# Patient Record
Sex: Female | Born: 1995 | Race: White | Hispanic: No | Marital: Single | Smoking: Former smoker
Health system: Southern US, Community
[De-identification: ages and names within clinical notes are randomized; demographics above are authoritative.]

## PROBLEM LIST (undated history)

## (undated) DIAGNOSIS — Z975 Presence of (intrauterine) contraceptive device: Secondary | ICD-10-CM

## (undated) DIAGNOSIS — B379 Candidiasis, unspecified: Secondary | ICD-10-CM

## (undated) DIAGNOSIS — Z8619 Personal history of other infectious and parasitic diseases: Secondary | ICD-10-CM

## (undated) DIAGNOSIS — Z87440 Personal history of urinary (tract) infections: Principal | ICD-10-CM

## (undated) DIAGNOSIS — N920 Excessive and frequent menstruation with regular cycle: Principal | ICD-10-CM

## (undated) DIAGNOSIS — N946 Dysmenorrhea, unspecified: Secondary | ICD-10-CM

## (undated) DIAGNOSIS — N760 Acute vaginitis: Secondary | ICD-10-CM

## (undated) DIAGNOSIS — R8290 Unspecified abnormal findings in urine: Principal | ICD-10-CM

## (undated) DIAGNOSIS — B9689 Other specified bacterial agents as the cause of diseases classified elsewhere: Secondary | ICD-10-CM

## (undated) DIAGNOSIS — N83209 Unspecified ovarian cyst, unspecified side: Secondary | ICD-10-CM

## (undated) DIAGNOSIS — N390 Urinary tract infection, site not specified: Secondary | ICD-10-CM

## (undated) DIAGNOSIS — N898 Other specified noninflammatory disorders of vagina: Principal | ICD-10-CM

## (undated) HISTORY — DX: Excessive and frequent menstruation with regular cycle: N92.0

## (undated) HISTORY — DX: Unspecified abnormal findings in urine: R82.90

## (undated) HISTORY — DX: Presence of (intrauterine) contraceptive device: Z97.5

## (undated) HISTORY — DX: Other specified bacterial agents as the cause of diseases classified elsewhere: B96.89

## (undated) HISTORY — DX: Dysmenorrhea, unspecified: N94.6

## (undated) HISTORY — DX: Acute vaginitis: N76.0

## (undated) HISTORY — DX: Unspecified ovarian cyst, unspecified side: N83.209

## (undated) HISTORY — PX: TONSILLECTOMY AND ADENOIDECTOMY: SHX28

## (undated) HISTORY — DX: Personal history of urinary (tract) infections: Z87.440

## (undated) HISTORY — DX: Other specified noninflammatory disorders of vagina: N89.8

## (undated) HISTORY — PX: WISDOM TOOTH EXTRACTION: SHX21

## (undated) HISTORY — DX: Candidiasis, unspecified: B37.9

## (undated) HISTORY — DX: Urinary tract infection, site not specified: N39.0

## (undated) HISTORY — DX: Personal history of other infectious and parasitic diseases: Z86.19

---
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
## 2016-02-12 ENCOUNTER — Ambulatory Visit (INDEPENDENT_AMBULATORY_CARE_PROVIDER_SITE_OTHER): Payer: BC Managed Care – PPO | Admitting: Family

## 2016-02-12 VITALS — BP 107/73 | HR 80 | Temp 97.6°F | Ht 62.0 in | Wt 127.0 lb

## 2016-02-12 DIAGNOSIS — R109 Unspecified abdominal pain: Secondary | ICD-10-CM | POA: Diagnosis not present

## 2016-02-12 DIAGNOSIS — N3000 Acute cystitis without hematuria: Secondary | ICD-10-CM | POA: Diagnosis not present

## 2016-02-12 LAB — URINALYSIS, COMPLETE
Bilirubin, UA: NEGATIVE
GLUCOSE, UA: NEGATIVE
Ketones, UA: NEGATIVE
LEUKOCYTES UA: NEGATIVE
Nitrite, UA: NEGATIVE
PROTEIN UA: NEGATIVE
RBC, UA: NEGATIVE
Specific Gravity, UA: >1.03 — ABNORMAL HIGH (ref 1.005–1.030)
Urobilinogen, Ur: 0.2 mg/dL (ref 0.2–1.0)
pH, UA: 6 (ref 5.0–7.5)

## 2016-02-12 LAB — MICROSCOPIC EXAMINATION: Epithelial Cells (non renal): >10 /hpf — AB (ref 0–10)

## 2016-02-12 MED ORDER — SULFAMETHOXAZOLE-TRIMETHOPRIM 800-160 MG PO TABS: 1.0000 | ORAL_TABLET | Freq: Two times a day (BID) | ORAL | Status: DC

## 2016-02-12 NOTE — Progress Notes (Signed)
   Subjective:    Patient ID: Pamela [name], female    DOB: 20-Jun-1996, 20 y.o.   MRN: [id number]  Back Pain This is a new problem. The current episode started in the past 7 days. The problem occurs constantly. The problem is unchanged. Pain location: left flank pain. The pain is at a severity of 10/10. The pain is moderate. Associated symptoms include abdominal pain. Pertinent negatives include no bladder incontinence, bowel incontinence, headaches or numbness.  Abdominal Pain Associated symptoms include frequency and nausea. Pertinent negatives include no headaches, hematuria or vomiting.  Urinary Frequency  This is a new problem. The current episode started in the past 7 days. The problem occurs every urination. The problem has been unchanged. The patient is experiencing no pain. Associated symptoms include flank pain, frequency, nausea and urgency. Pertinent negatives include no chills, hematuria or vomiting. She has tried increased fluids for the symptoms. The treatment provided mild relief.      Review of Systems  Constitutional: Negative.  Negative for chills.  HENT: Negative.   Eyes: Negative.   Respiratory: Negative.  Negative for shortness of breath.   Cardiovascular: Negative.  Negative for palpitations.  Gastrointestinal: Positive for nausea and abdominal pain. Negative for vomiting and bowel incontinence.  Endocrine: Negative.   Genitourinary: Positive for urgency, frequency and flank pain. Negative for bladder incontinence and hematuria.  Musculoskeletal: Positive for back pain.  Neurological: Negative.  Negative for numbness and headaches.  Hematological: Negative.   Psychiatric/Behavioral: Negative.   All other systems reviewed and are negative.      Objective:   Physical Exam  Constitutional: She is oriented to person, place, and time. She appears well-developed and well-nourished. No distress.  HENT:  Head: Normocephalic and atraumatic.  Right Ear: External ear  normal.  Left Ear: External ear normal.  Mouth/Throat: Oropharynx is clear and moist.  Eyes: Pupils are equal, round, and reactive to light.  Neck: Normal range of motion. Neck supple. No thyromegaly present.  Cardiovascular: Normal rate, regular rhythm, normal heart sounds and intact distal pulses.   No murmur heard. Pulmonary/Chest: Effort normal and breath sounds normal. No respiratory distress. She has no wheezes.  Abdominal: Soft. Bowel sounds are normal. She exhibits no distension. There is no tenderness.  Musculoskeletal: Normal range of motion. She exhibits no edema or tenderness.  Negative for CVA tenderness   Neurological: She is alert and oriented to person, place, and time. She has normal reflexes. No cranial nerve deficit.  Skin: Skin is warm and dry.  Psychiatric: She has a normal mood and affect. Her behavior is normal. Judgment and thought content normal.  Vitals reviewed.   BP 107/73 mmHg  Pulse 80  Temp(Src) 97.6 F (36.4 C) (Oral)  Ht 5\' 2"  (1.575 m)  Wt 127 lb (57.607 kg)  BMI 23.22 kg/m2       Assessment & Plan:  1. Abdominal pressure - Urinalysis, Complete  2. Acute cystitis without hematuria -Force fluids AZO over the counter X2 days RTO prn -urine Culture pending  - sulfamethoxazole-trimethoprim (BACTRIM DS) 800-160 MG tablet; Take 1 tablet by mouth 2 (two) times daily.  Dispense: 14 tablet; Refill: 0  Evelina Dun, FNP

## 2016-02-12 NOTE — Patient Instructions (Signed)
Asymptomatic Bacteriuria, Female Asymptomatic bacteriuria is the presence of a large number of bacteria in your urine without the usual symptoms of burning or frequent urination. The following conditions increase the risk of asymptomatic bacteriuria:  Diabetes mellitus.  Advanced age.  Pregnancy in the first trimester.  Kidney stones.  Kidney transplants.  Leaky kidney tube valve in young children (reflux). Treatment for this condition is not needed in most people and can lead to other problems such as too much yeast and growth of resistant bacteria. However, some people, such as pregnant women, do need treatment to prevent kidney infection. Asymptomatic bacteriuria in pregnancy is also associated with fetal growth restriction, premature labor, and newborn death. HOME CARE INSTRUCTIONS Monitor your condition for any changes. The following actions may help to relieve any discomfort you are feeling:  Drink enough water and fluids to keep your urine clear or pale yellow. Go to the bathroom more often to keep your bladder empty.  Keep the area around your vagina and rectum clean. Wipe yourself from front to back after urinating. SEEK IMMEDIATE MEDICAL CARE IF:  You develop signs of an infection such as:  Burning with urination.  Frequency of voiding.  Back pain.  Fever.  You have blood in the urine.  You develop a fever. MAKE SURE YOU:  Understand these instructions.  Will watch your condition.  Will get help right away if you are not doing well or get worse.   This information is not intended to replace advice given to you by your health care provider. Make sure you discuss any questions you have with your health care provider.   Document Released: 11/07/2005 Document Revised: 11/28/2014 Document Reviewed: 04/29/2013 [employer]vier Interactive Patient Education 2016 [employer]  

## 2016-02-14 LAB — URINE CULTURE

## 2016-03-23 ENCOUNTER — Encounter: Payer: Self-pay | Admitting: Family Medicine

## 2016-03-23 ENCOUNTER — Ambulatory Visit (INDEPENDENT_AMBULATORY_CARE_PROVIDER_SITE_OTHER): Payer: BC Managed Care – PPO | Admitting: Family Medicine

## 2016-03-23 ENCOUNTER — Encounter: Payer: Self-pay | Admitting: *Deleted

## 2016-03-23 VITALS — BP 114/72 | HR 80 | Temp 97.2°F | Ht 62.0 in | Wt 127.6 lb

## 2016-03-23 DIAGNOSIS — R350 Frequency of micturition: Secondary | ICD-10-CM

## 2016-03-23 DIAGNOSIS — J209 Acute bronchitis, unspecified: Secondary | ICD-10-CM

## 2016-03-23 MED ORDER — AZITHROMYCIN 250 MG PO TABS: ORAL_TABLET | ORAL | Status: DC

## 2016-03-23 NOTE — Progress Notes (Signed)
   Subjective:    Patient ID: Pamela [name], female    DOB: 08-Feb-1996, 20 y.o.   MRN: [id number]  HPI 20 year old with a three-day history of sinus pressure and drainage cough with wheezing. Cough is productive of yellow sputum. Patient is a smoker. No fever or chills.  Patient Active Problem List   Diagnosis Date Noted  . History of UTI 09/22/2015  . History of chlamydia 09/22/2015  . Nexplanon in place 08/21/2015  . Bad odor of urine 08/21/2015  . UTI (lower urinary tract infection) 08/21/2015  . BV (bacterial vaginosis) 01/28/2015  . Vaginal discharge 12/11/2014  . Yeast infection 12/11/2014  . Menorrhagia with regular cycle 10/14/2014  . Dysmenorrhea 10/14/2014   Outpatient Encounter Prescriptions as of 03/23/2016  Medication Sig  . EPIPEN 2-PAK 0.3 MG/0.3ML SOAJ injection 0.3 mg as needed. Reported on 02/12/2016  . etonogestrel (NEXPLANON) 68 MG IMPL implant 1 each by Subdermal route once.  . [DISCONTINUED] sulfamethoxazole-trimethoprim (BACTRIM DS) 800-160 MG tablet Take 1 tablet by mouth 2 (two) times daily.   No facility-administered encounter medications on file as of 03/23/2016.      Review of Systems  Constitutional: Negative.   HENT: Positive for congestion and sinus pressure.   Respiratory: Positive for cough.        Objective:   Physical Exam  Constitutional: She appears well-developed and well-nourished.  HENT:  Mouth/Throat: Oropharynx is clear and moist.  Both maxillary and frontal sinuses are tender to percussion  Cardiovascular: Normal rate and regular rhythm.   Pulmonary/Chest: No respiratory distress.          Assessment & Plan:  1. Urinary frequency Apparently left unable to get a urine - Urinalysis, Complete  2. Acute bronchitis, unspecified organism With wheezing and productive cough suspect acute bronchitis. Will treat with Zithromax (Z-Pak) and a sample of Brio for wheezing and cough. Try to quit smoking at least while she's got the  symptoms  Wardell Honour MD

## 2016-05-17 ENCOUNTER — Encounter: Payer: Self-pay | Admitting: Obstetrics & Gynecology

## 2016-05-17 ENCOUNTER — Ambulatory Visit (INDEPENDENT_AMBULATORY_CARE_PROVIDER_SITE_OTHER): Payer: BC Managed Care – PPO | Admitting: Obstetrics & Gynecology

## 2016-05-17 VITALS — BP 110/60 | HR 76 | Wt 124.0 lb

## 2016-05-17 DIAGNOSIS — N939 Abnormal uterine and vaginal bleeding, unspecified: Secondary | ICD-10-CM | POA: Diagnosis not present

## 2016-05-17 DIAGNOSIS — N930 Postcoital and contact bleeding: Secondary | ICD-10-CM

## 2016-05-17 DIAGNOSIS — R252 Cramp and spasm: Secondary | ICD-10-CM | POA: Diagnosis not present

## 2016-05-17 DIAGNOSIS — S30814A Abrasion of vagina and vulva, initial encounter: Secondary | ICD-10-CM | POA: Diagnosis not present

## 2016-05-17 MED ORDER — MEGESTROL ACETATE 40 MG PO TABS: ORAL_TABLET | ORAL | Status: DC

## 2016-05-17 NOTE — Progress Notes (Signed)
Patient ID: Pamela [name], female   DOB: [birth year], 20 y.o.   MRN: [id number]      Chief Complaint  Patient presents with  . gyn visit    c/o bright red bleeding/ heavy since Saturday/ stomach pain    Blood pressure 110/60, pulse 76, weight 124 lb (56.2 kg), last menstrual period 04/27/2016.  20 y.o. G0P0000 Patient's last menstrual period was 04/27/2016. The current method of family planning is nexplanon.  Outpatient Encounter Prescriptions as of 05/17/2016  Medication Sig Note  . etonogestrel (NEXPLANON) 68 MG IMPL implant 1 each by Subdermal route once.   . EPIPEN 2-PAK 0.3 MG/0.3ML SOAJ injection 0.3 mg as needed. Reported on 05/17/2016 01/28/2015: Received from: External Pharmacy  . [DISCONTINUED] azithromycin (ZITHROMAX) 250 MG tablet Take as directed   . [DISCONTINUED] megestrol (MEGACE) 40 MG tablet 3 tablets a day for 5 days, 2 tablets a day for 5 days then 1 tablet daily (Patient not taking: Reported on 06/14/2016)    No facility-administered encounter medications on file as of 05/17/2016.    Subjective Pt began vaginal bleeding after a sexual encounter a few days ago Unsure if related or not Heavy some cramping less now  Objective Gen WDWN female NAD Vulva:  normal appearing vulva with no masses, tenderness or lesions Vagina:  4 cm vaginal laceration not actively bleeding not infected Cervix:  no cervical motion tenderness and no lesions Uterus:  normal size, contour, position, consistency, mobility, non-tender Adnexa: ovaries:present,  normal adnexa in size, nontender and no masses   Pertinent ROS No burning with urination, frequency or urgency No nausea, vomiting or diarrhea Nor fever chills or other constitutional symptoms   Labs or studies     Impression Diagnoses this Encounter::   ICD-9-CM ICD-10-CM   1. Vaginal abrasion, initial encounter 911.0 S30.814A   2. Abnormal uterine bleeding (AUB) 626.9 N93.9     Established relevant  diagnosis(es):   Plan/Recommendations: Meds ordered this encounter  Medications  . DISCONTD: megestrol (MEGACE) 40 MG tablet    Sig: 3 tablets a day for 5 days, 2 tablets a day for 5 days then 1 tablet daily    Dispense:  35 tablet    Refill:  3    Labs or Scans Ordered: No orders of the defined types were placed in this encounter.   Management:: No specific therapy needed except pelvic rest for 2 weeks Make sure effective moisture prior to vaginal intercourse  Follow up Return if symptoms worsen or fail to improve.         All questions were answered.

## 2016-06-01 ENCOUNTER — Ambulatory Visit: Payer: BC Managed Care – PPO | Admitting: Advanced Practice Midwife

## 2016-06-01 ENCOUNTER — Encounter: Payer: Self-pay | Admitting: Advanced Practice Midwife

## 2016-06-14 ENCOUNTER — Encounter: Payer: Self-pay | Admitting: Family Medicine

## 2016-06-14 ENCOUNTER — Ambulatory Visit (INDEPENDENT_AMBULATORY_CARE_PROVIDER_SITE_OTHER): Payer: BC Managed Care – PPO | Admitting: Family Medicine

## 2016-06-14 VITALS — BP 107/69 | HR 76 | Temp 97.3°F | Ht 62.0 in | Wt 126.8 lb

## 2016-06-14 DIAGNOSIS — N898 Other specified noninflammatory disorders of vagina: Secondary | ICD-10-CM | POA: Diagnosis not present

## 2016-06-14 DIAGNOSIS — N760 Acute vaginitis: Secondary | ICD-10-CM | POA: Diagnosis not present

## 2016-06-14 DIAGNOSIS — A499 Bacterial infection, unspecified: Secondary | ICD-10-CM

## 2016-06-14 DIAGNOSIS — R300 Dysuria: Secondary | ICD-10-CM

## 2016-06-14 DIAGNOSIS — B9689 Other specified bacterial agents as the cause of diseases classified elsewhere: Secondary | ICD-10-CM

## 2016-06-14 LAB — URINALYSIS, COMPLETE
Bilirubin, UA: NEGATIVE
GLUCOSE, UA: NEGATIVE
Ketones, UA: NEGATIVE
LEUKOCYTES UA: NEGATIVE
NITRITE UA: NEGATIVE
Protein, UA: NEGATIVE
RBC, UA: NEGATIVE
SPEC GRAV UA: 1.025 (ref 1.005–1.030)
Urobilinogen, Ur: 0.2 mg/dL (ref 0.2–1.0)
pH, UA: 6.5 (ref 5.0–7.5)

## 2016-06-14 LAB — WET PREP FOR TRICH, YEAST, CLUE
CLUE CELL EXAM: NEGATIVE
Trichomonas Exam: NEGATIVE
YEAST EXAM: NEGATIVE

## 2016-06-14 LAB — MICROSCOPIC EXAMINATION: RBC, UA: NONE SEEN /hpf (ref 0–?)

## 2016-06-14 MED ORDER — METRONIDAZOLE 0.75 % VA GEL: 1.0000 | Freq: Every day | VAGINAL | 0 refills | Status: DC

## 2016-06-14 NOTE — Progress Notes (Signed)
Subjective:  Patient ID: Pamela [name], female    DOB: 20-Sep-1996  Age: 20 y.o. MRN: [id number]  CC: Urinary Tract Infection (urinary frequency, LBP x 1 wk) and Vaginal Discharge (stong odor x 1 wk)   HPI Pamela [name] presents for Hx BV. Recent new partner. Used condom. First activity in 6 mos. DC started several days aago. Fishyodor similar to previous BV episodes.some midline LBP.    History Pamela [name] has a past medical history of Bad odor of urine (08/21/2015); BV (bacterial vaginosis) (01/28/2015); Dysmenorrhea (10/14/2014); History of chlamydia (09/22/2015); History of UTI (09/22/2015); Menorrhagia with regular cycle (10/14/2014); Nexplanon in place (08/21/2015); UTI (lower urinary tract infection) (08/21/2015); Vaginal discharge (12/11/2014); and Yeast infection (12/11/2014).   She has a past surgical history that includes Wisdom tooth extraction and Tonsillectomy and adenoidectomy.   Her family history includes COPD in her paternal grandfather; Cancer in her maternal grandmother; Diabetes in her paternal grandmother; Hypertension in her paternal grandfather and paternal grandmother.She reports that she has been smoking Cigarettes.  She has smoked for the past 0.50 years. She has never used smokeless tobacco. She reports that she does not drink alcohol or use drugs.    ROS Review of Systems  Constitutional: Negative for chills, diaphoresis and fever.  HENT: Negative for congestion.   Eyes: Negative for visual disturbance.  Respiratory: Negative for cough and shortness of breath.   Cardiovascular: Negative for chest pain and palpitations.  Gastrointestinal: Negative for constipation, diarrhea and nausea.  Genitourinary: Positive for dysuria, frequency and urgency. Negative for decreased urine volume, flank pain, hematuria, menstrual problem and pelvic pain.  Musculoskeletal: Negative for arthralgias and joint swelling.  Skin: Negative for rash.  Neurological: Negative for dizziness and  numbness.    Objective:  BP 107/69 (BP Location: Right Arm, Patient Position: Sitting, Cuff Size: Normal)   Pulse 76   Temp 97.3 F (36.3 C) (Oral)   Ht 5\' 2"  (1.575 m)   Wt 126 lb 12.8 oz (57.5 kg)   LMP 04/27/2016   SpO2 100%   BMI 23.19 kg/m   BP Readings from Last 3 Encounters:  06/14/16 107/69  05/17/16 110/60  03/23/16 114/72    Wt Readings from Last 3 Encounters:  06/14/16 126 lb 12.8 oz (57.5 kg) (51 %, Z= 0.02)*  05/17/16 124 lb (56.2 kg) (46 %, Z= -0.11)*  03/23/16 127 lb 9.6 oz (57.9 kg) (54 %, Z= 0.09)*   * Growth percentiles are based on CDC 2-20 Years data.     Physical Exam  Constitutional: She is oriented to person, place, and time. She appears well-developed and well-nourished.  HENT:  Head: Normocephalic and atraumatic.  Cardiovascular: Normal rate and regular rhythm.   No murmur heard. Pulmonary/Chest: Effort normal and breath sounds normal.  Abdominal: Soft. Bowel sounds are normal. She exhibits no mass. There is no tenderness. There is no rebound and no guarding.  Musculoskeletal: She exhibits no tenderness.  Neurological: She is alert and oriented to person, place, and time.  Skin: Skin is warm and dry.  Psychiatric: She has a normal mood and affect. Her behavior is normal.     Lab Results  Component Value Date   HGB 14.6 07/03/2013    US Breast Right  Result Date: 01/16/2013 *RADIOLOGY REPORT* Clinical Data:  The patient reports focal pain right breast upper outer quadrant.  Additionally, the history reported a questioned palpable finding in the right breast six o'clock location, but the patient specifically states that no palpable abnormality was found  been at that or any other area in either breast after recent physical exam. RIGHT BREAST ULTRASOUND Comparison:  None. On physical exam, I palpate no abnormality in the right upper outer quadrant. Findings: Ultrasound is performed, showing normal-appearing fibroglandular tissue in the right  upper outer quadrant. IMPRESSION: No evidence for malignancy at the site of the questioned focal pain right upper outer quadrant.  Clinical management is recommended. RECOMMENDATION: Screening mammography is recommended beginning at the age of 20. I have discussed the findings and recommendations with the patient. Results were also provided in writing at the conclusion of the visit. BI-RADS CATEGORY 1:  Negative. Original Report Authenticated By: Conchita Paris, M.D.    Assessment & Plan:   Pamela [name] was seen today for urinary tract infection and vaginal discharge.  Diagnoses and all orders for this visit:  Bacterial vaginitis  Dysuria -     Urinalysis, Complete  Vaginal discharge -     WET PREP FOR TRICH, YEAST, CLUE -     GC/Chlamydia Probe Amp  Other orders -     metroNIDAZOLE (METROGEL VAGINAL) 0.75 % vaginal gel; Place 1 Applicatorful vaginally at bedtime. -     Microscopic Examination   I am having Pamela [name] start on metroNIDAZOLE. I am also having her maintain her EPIPEN 2-PAK, etonogestrel, and megestrol.  Meds ordered this encounter  Medications  . metroNIDAZOLE (METROGEL VAGINAL) 0.75 % vaginal gel    Sig: Place 1 Applicatorful vaginally at bedtime.    Dispense:  70 g    Refill:  0     Follow-up: Return if symptoms worsen or fail to improve.  Claretta Fraise, M.D.

## 2016-06-16 LAB — GC/CHLAMYDIA PROBE AMP
Chlamydia trachomatis, NAA: NEGATIVE
NEISSERIA GONORRHOEAE BY PCR: NEGATIVE

## 2016-07-07 ENCOUNTER — Encounter: Payer: Self-pay | Admitting: Obstetrics & Gynecology

## 2016-07-07 ENCOUNTER — Ambulatory Visit (INDEPENDENT_AMBULATORY_CARE_PROVIDER_SITE_OTHER): Payer: BC Managed Care – PPO | Admitting: Obstetrics & Gynecology

## 2016-07-07 VITALS — BP 120/80 | HR 80 | Wt 127.0 lb

## 2016-07-07 DIAGNOSIS — Z975 Presence of (intrauterine) contraceptive device: Secondary | ICD-10-CM

## 2016-07-07 DIAGNOSIS — N719 Inflammatory disease of uterus, unspecified: Secondary | ICD-10-CM | POA: Diagnosis not present

## 2016-07-07 DIAGNOSIS — N939 Abnormal uterine and vaginal bleeding, unspecified: Secondary | ICD-10-CM | POA: Diagnosis not present

## 2016-07-07 MED ORDER — MEGESTROL ACETATE 40 MG PO TABS: ORAL_TABLET | ORAL | 3 refills | Status: DC

## 2016-07-07 MED ORDER — DOXYCYCLINE HYCLATE 100 MG PO TABS: 100.0000 mg | ORAL_TABLET | Freq: Two times a day (BID) | ORAL | 0 refills | Status: DC

## 2016-07-07 NOTE — Progress Notes (Signed)
      Chief Complaint  Patient presents with  . Follow-up    Blood pressure 120/80, pulse 80, weight 127 lb (57.6 kg).  20 y.o. G0P0000 No LMP recorded. Patient has had an implant. The current method of family planning is Nexplanon.  Subjective Pt has had vaginal bleeding and discomfort since she had sex Saturday No pain with sex this time Some strong smell to her urine No odor to her discharge  Objective NEFG  Blood in the vault no evidence of laceration or trauma No discharge +/- CMT some tenderness to uterine palpation Adnexa negative  Pertinent ROS No burning with urination, frequency or urgency No nausea, vomiting or diarrhea Nor fever chills or other constitutional symptoms   Labs or studies     Impression Diagnoses this Encounter::   ICD-9-CM ICD-10-CM   1. Abnormal uterine bleeding (AUB) 626.9 N93.9   2. Endometritis 615.9 N71.9     Established relevant diagnosis(es):   Plan/Recommendations: Meds ordered this encounter  Medications  . doxycycline (VIBRA-TABS) 100 MG tablet    Sig: Take 1 tablet (100 mg total) by mouth 2 (two) times daily.    Dispense:  20 tablet    Refill:  0  . megestrol (MEGACE) 40 MG tablet    Sig: 3 tablets a day for 5 days, 2 tablets a day for 5 days then 1 tablet daily    Dispense:  35 tablet    Refill:  3    Labs or Scans Ordered: No orders of the defined types were placed in this encounter.   Management:: Doxycycline course for possible endometritis which can also help stabilize/synchronize her endometrium Megestrol to synchronize her endometrium in light of Nexplanon/progeseterone only effects  Follow up Return in about 1 month (around 08/07/2016) for Follow up, with Dr Elonda Husky.     All questions were answered.

## 2016-08-05 ENCOUNTER — Encounter: Payer: Self-pay | Admitting: Family Medicine

## 2016-08-05 ENCOUNTER — Ambulatory Visit (INDEPENDENT_AMBULATORY_CARE_PROVIDER_SITE_OTHER): Payer: BC Managed Care – PPO | Admitting: Family Medicine

## 2016-08-05 DIAGNOSIS — F418 Other specified anxiety disorders: Secondary | ICD-10-CM | POA: Diagnosis not present

## 2016-08-05 DIAGNOSIS — F419 Anxiety disorder, unspecified: Principal | ICD-10-CM

## 2016-08-05 DIAGNOSIS — F32A Depression, unspecified: Secondary | ICD-10-CM | POA: Insufficient documentation

## 2016-08-05 DIAGNOSIS — F329 Major depressive disorder, single episode, unspecified: Secondary | ICD-10-CM | POA: Insufficient documentation

## 2016-08-05 MED ORDER — ESCITALOPRAM OXALATE 10 MG PO TABS: 10.0000 mg | ORAL_TABLET | Freq: Every day | ORAL | 1 refills | Status: DC
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
## 2016-08-06 LAB — CBC WITH DIFFERENTIAL/PLATELET
BASOS ABS: 0.1 10*3/uL (ref 0.0–0.2)
Basos: 1 %
EOS (ABSOLUTE): 0.3 10*3/uL (ref 0.0–0.4)
Eos: 4 %
HEMOGLOBIN: 14.6 g/dL (ref 11.1–15.9)
Hematocrit: 42.4 % (ref 34.0–46.6)
IMMATURE GRANS (ABS): 0 10*3/uL (ref 0.0–0.1)
Immature Granulocytes: 0 %
LYMPHS: 31 %
Lymphocytes Absolute: 2.7 10*3/uL (ref 0.7–3.1)
MCH: 31.7 pg (ref 26.6–33.0)
MCHC: 34.4 g/dL (ref 31.5–35.7)
MCV: 92 fL (ref 79–97)
MONOCYTES: 8 %
Monocytes Absolute: 0.7 10*3/uL (ref 0.1–0.9)
NEUTROS ABS: 4.9 10*3/uL (ref 1.4–7.0)
Neutrophils: 56 %
PLATELETS: 261 10*3/uL (ref 150–379)
RBC: 4.61 x10E6/uL (ref 3.77–5.28)
RDW: 12.6 % (ref 12.3–15.4)
WBC: 8.7 10*3/uL (ref 3.4–10.8)

## 2016-08-06 LAB — TSH: TSH: 1.52 u[IU]/mL (ref 0.450–4.500)

## 2016-08-08 ENCOUNTER — Ambulatory Visit: Payer: BC Managed Care – PPO | Admitting: Obstetrics & Gynecology

## 2016-09-02 ENCOUNTER — Ambulatory Visit: Payer: BC Managed Care – PPO | Admitting: Family Medicine

## 2017-01-11 ENCOUNTER — Encounter: Payer: Self-pay | Admitting: Family Medicine

## 2017-01-11 ENCOUNTER — Telehealth: Payer: Self-pay | Admitting: Obstetrics & Gynecology

## 2017-01-11 ENCOUNTER — Ambulatory Visit (INDEPENDENT_AMBULATORY_CARE_PROVIDER_SITE_OTHER): Payer: BC Managed Care – PPO | Admitting: Family Medicine

## 2017-01-11 VITALS — BP 114/74 | HR 88 | Temp 97.7°F | Ht 62.01 in | Wt 129.8 lb

## 2017-01-11 DIAGNOSIS — F32A Depression, unspecified: Secondary | ICD-10-CM

## 2017-01-11 DIAGNOSIS — F418 Other specified anxiety disorders: Secondary | ICD-10-CM

## 2017-01-11 DIAGNOSIS — F329 Major depressive disorder, single episode, unspecified: Secondary | ICD-10-CM

## 2017-01-11 DIAGNOSIS — F419 Anxiety disorder, unspecified: Secondary | ICD-10-CM

## 2017-01-11 DIAGNOSIS — M545 Low back pain: Secondary | ICD-10-CM

## 2017-01-11 LAB — URINALYSIS, COMPLETE
Bilirubin, UA: NEGATIVE
Glucose, UA: NEGATIVE
KETONES UA: NEGATIVE
Leukocytes, UA: NEGATIVE
NITRITE UA: NEGATIVE
Protein, UA: NEGATIVE
RBC UA: NEGATIVE
SPEC GRAV UA: 1.02 (ref 1.005–1.030)
Urobilinogen, Ur: 0.2 mg/dL (ref 0.2–1.0)
pH, UA: 7 (ref 5.0–7.5)

## 2017-01-11 LAB — MICROSCOPIC EXAMINATION
Epithelial Cells (non renal): >10 /hpf — AB (ref 0–10)
RENAL EPITHEL UA: NONE SEEN /HPF

## 2017-01-11 MED ORDER — FLUOXETINE HCL 20 MG PO TABS: 20.0000 mg | ORAL_TABLET | Freq: Every day | ORAL | 1 refills | Status: DC

## 2017-01-11 MED ORDER — FLUCONAZOLE 150 MG PO TABS: 150.0000 mg | ORAL_TABLET | Freq: Once | ORAL | 0 refills | Status: AC

## 2017-01-11 MED ORDER — CEPHALEXIN 500 MG PO CAPS: 500.0000 mg | ORAL_CAPSULE | Freq: Four times a day (QID) | ORAL | 0 refills | Status: DC

## 2017-01-11 NOTE — Telephone Encounter (Signed)
Pt states she has had a Nexplanon in for 2 yrs and now is having pain at incision site that radiates into muscle around device, pt denies any redness or swelling but states it is tender to the touch.  Advised pt need to have OV to evaluate and call transferred to front staff for appointment to be made.

## 2017-01-11 NOTE — Progress Notes (Signed)
BP 114/74   Pulse 88   Temp 97.7 F (36.5 C) (Oral)   Ht 5' 2.01" (1.575 m)   Wt 129 lb 12.8 oz (58.9 kg)   BMI 23.73 kg/m    Subjective:    Patient ID: Pamela [name], female    DOB: 04/01/1996, 21 y.o.   MRN: [id number]  HPI: Pamela [name] is a 21 y.o. female presenting on 01/11/2017 for Back Pain (x 3 days)   HPI Back pain Patient has been having low back pain that has been intense and 7 out of 10 for the past 3 days. It is bilateral and near her belt line on her lower back. She denies it radiating anywhere else such as down her legs or upper back. She has not had pain this severe previously. She denies any specific trauma or twisting or event that caused this. She has been working in a more physical job as a Engineer, site and moving things and she thinks it may be associated to that. She denies any fevers or chills or overlying skin changes.She thinks she may have had some urinary frequency and wants her urine tested but denies any hematuria or fevers or chills or abdominal pain  Anxiety and depression Patient has been having a lot of issues with anxiety and her stress building up it's keeping her up at night. She feels like the Lexapro made her feel weird and did not like it at all and took it for about 3 weeks but then stopped taking it and would like to try something else for this. She denies to me feelings of sadness or depression or feeling down. She says that at least 2 weeks and she was on the Lexapro. She denies any suicidal ideations or thoughts of hurting herself. She says she just needs something that will help her a little better with her anxiety and feelings of depression but doesn't make her feel the way that the Lexapro did.  Relevant past medical, surgical, family and social history reviewed and updated as indicated. Interim medical history since our last visit reviewed. Allergies and medications reviewed and updated.  Review of Systems  Constitutional: Negative  for chills and fever.  Respiratory: Negative for chest tightness and shortness of breath.   Cardiovascular: Negative for chest pain and leg swelling.  Genitourinary: Negative for difficulty urinating and dysuria.  Musculoskeletal: Positive for back pain. Negative for gait problem.  Skin: Negative for rash.  Neurological: Negative for light-headedness and headaches.  Psychiatric/Behavioral: Positive for dysphoric mood and sleep disturbance. Negative for agitation, behavioral problems, self-injury and suicidal ideas. The patient is nervous/anxious.   All other systems reviewed and are negative.   Per HPI unless specifically indicated above     Objective:    BP 114/74   Pulse 88   Temp 97.7 F (36.5 C) (Oral)   Ht 5' 2.01" (1.575 m)   Wt 129 lb 12.8 oz (58.9 kg)   BMI 23.73 kg/m   Wt Readings from Last 3 Encounters:  01/11/17 129 lb 12.8 oz (58.9 kg) (54 %, Z= 0.10)*  08/05/16 128 lb (58.1 kg) (53 %, Z= 0.06)*  07/07/16 127 lb (57.6 kg) (51 %, Z= 0.03)*   * Growth percentiles are based on CDC 2-20 Years data.    Physical Exam  Constitutional: She is oriented to person, place, and time. She appears well-developed and well-nourished. No distress.  Eyes: Conjunctivae are normal.  Neck: Neck supple. No thyromegaly present.  Cardiovascular: Normal rate, regular rhythm,  normal heart sounds and intact distal pulses.   No murmur heard. Pulmonary/Chest: Effort normal and breath sounds normal. No respiratory distress. She has no wheezes.  Abdominal: Soft. Bowel sounds are normal. She exhibits no distension. There is no tenderness. There is no rebound and no guarding.  Musculoskeletal: Normal range of motion. She exhibits no edema.       Lumbar back: She exhibits tenderness (Bilateral lower back paraspinal tenderness, consistent with muscular strain, negative straight leg raise bilaterally) and spasm. She exhibits normal range of motion, no bony tenderness and normal pulse.    Lymphadenopathy:    She has no cervical adenopathy.  Neurological: She is alert and oriented to person, place, and time. Coordination normal.  Skin: Skin is warm and dry. No rash noted. She is not diaphoretic.  Psychiatric: Her behavior is normal. Her mood appears anxious. She exhibits a depressed mood. She expresses no suicidal ideation. She expresses no suicidal plans.  Nursing note and vitals reviewed.  Urinalysis: Nitrite negative, 0-5 WBCs, otherwise negative    Assessment & Plan:   Problem List Items Addressed This Visit      Other   Anxiety and depression   Relevant Medications   FLUoxetine (PROZAC) 20 MG tablet    Other Visit Diagnoses    Low back pain, unspecified back pain laterality, unspecified chronicity, with sciatica presence unspecified    -  Primary   Relevant Medications   cephALEXin (KEFLEX) 500 MG capsule   Other Relevant Orders   Urinalysis, Complete (Completed)   Microscopic Examination (Completed)     We'll treat like UTI just in case it may be correlated to that   Follow up plan: Return in about 4 weeks (around 02/08/2017), or if symptoms worsen or fail to improve, for Recheck anxiety and depression.  Counseling provided for all of the vaccine components Orders Placed This Encounter  Procedures  . Urinalysis, Complete    Caryl Pina, MD [facility] 01/11/2017, 2:36 PM

## 2017-01-17 ENCOUNTER — Ambulatory Visit: Payer: BC Managed Care – PPO | Admitting: Obstetrics & Gynecology

## 2017-03-21 ENCOUNTER — Ambulatory Visit: Payer: BC Managed Care – PPO | Admitting: Family

## 2017-03-23 ENCOUNTER — Encounter: Payer: Self-pay | Admitting: Pediatrics

## 2017-03-23 ENCOUNTER — Ambulatory Visit (INDEPENDENT_AMBULATORY_CARE_PROVIDER_SITE_OTHER): Payer: BC Managed Care – PPO | Admitting: Pediatrics

## 2017-03-23 VITALS — BP 113/72 | HR 80 | Temp 97.7°F | Ht 62.02 in | Wt 127.0 lb

## 2017-03-23 DIAGNOSIS — R062 Wheezing: Secondary | ICD-10-CM | POA: Diagnosis not present

## 2017-03-23 DIAGNOSIS — Z720 Tobacco use: Secondary | ICD-10-CM

## 2017-03-23 DIAGNOSIS — J069 Acute upper respiratory infection, unspecified: Secondary | ICD-10-CM

## 2017-03-23 MED ORDER — SPACER/AERO CHAMBER MOUTHPIECE MISC: 1.0000 | Freq: Four times a day (QID) | 0 refills | Status: DC | PRN

## 2017-03-23 MED ORDER — ALBUTEROL SULFATE HFA 108 (90 BASE) MCG/ACT IN AERS: 2.0000 | INHALATION_SPRAY | Freq: Four times a day (QID) | RESPIRATORY_TRACT | 2 refills | Status: DC | PRN

## 2017-03-23 NOTE — Patient Instructions (Addendum)
Netipot with distilled water 2-3 times a day to clear out sinuses Or Normal saline nasal spray Flonase steroid nasal spray Antihistamine daily such as cetirizine Ibuprofen/tylenol Lots of fluids  Albuterol 2 puffs three times a day for wheezing/cough

## 2017-03-23 NOTE — Progress Notes (Signed)
  Subjective:   Patient ID: Pamela [name], female    DOB: 1996/01/17, 21 y.o.   MRN: [id number] CC: Cough; Nasal Congestion; and Facial Pain  HPI: Pamela [name] is a 21 y.o. female presenting for Cough; Nasal Congestion; and Facial Pain  Ongoing for past 3 days +nasal drainage, coughing Sore throat yesterday, slightly scratchy still today Trying mucinex at home No fevers Appetite has been fine Smoking daily, less since shes been sick Interested in quitting  Relevant past medical, surgical, family and social history reviewed. Allergies and medications reviewed and updated. History  Smoking Status  . Current Every Day Smoker  . Years: 0.50  . Types: Cigarettes  Smokeless Tobacco  . Never Used    Comment: smokes 10 cig daily   ROS: Per HPI   Objective:    BP 113/72   Pulse 80   Temp 97.7 F (36.5 C) (Oral)   Ht 5' 2.02" (1.575 m)   Wt 127 lb (57.6 kg)   BMI 23.21 kg/m   Wt Readings from Last 3 Encounters:  03/23/17 127 lb (57.6 kg) (48 %, Z= -0.05)*  01/11/17 129 lb 12.8 oz (58.9 kg) (54 %, Z= 0.10)*  08/05/16 128 lb (58.1 kg) (53 %, Z= 0.06)*   * Growth percentiles are based on CDC 2-20 Years data.    Gen: NAD, alert, cooperative with exam, NCAT EYES: EOMI, no conjunctival injection, or no icterus ENT:  TMs dull gray b/l, OP without erythema, some discomfort palpation max sinuses LYMPH: small < 1cm cervical LAD CV: NRRR, normal S1/S2, no murmur Resp: moving air well, slight exp wheeze present throughout, normal WOB Abd: +BS, soft, NTND. no guarding or organomegaly Ext: No edema, warm Neuro: Alert and oriented  Assessment & Plan:  Avanni was seen today for cough, nasal congestion and facial pain.  Diagnoses and all orders for this visit:  Acute URI Symptoms for 3 days Discussed symptom care including sinus rinses, antihistamine, flonase Return precautions discussed.  Wheezing Slight wheeze Bothered most by coughing now, albuterol likely to help  cough -     albuterol (PROVENTIL HFA;VENTOLIN HFA) 108 (90 Base) MCG/ACT inhaler; Inhale 2 puffs into the lungs every 6 (six) hours as needed for wheezing or shortness of breath. -     Spacer/Aero Chamber Mouthpiece MISC; 1 each by Does not apply route every 6 (six) hours as needed.  Tobacco use Encouraged cessation  Follow up plan: prn Assunta Found, MD [place]

## 2017-03-24 ENCOUNTER — Telehealth: Payer: Self-pay | Admitting: Pediatrics

## 2017-03-24 ENCOUNTER — Ambulatory Visit: Payer: BC Managed Care – PPO | Admitting: Family

## 2017-03-24 DIAGNOSIS — J40 Bronchitis, not specified as acute or chronic: Secondary | ICD-10-CM

## 2017-03-24 MED ORDER — AZITHROMYCIN 250 MG PO TABS: ORAL_TABLET | ORAL | 0 refills | Status: DC

## 2017-03-24 NOTE — Telephone Encounter (Signed)
Patient aware and verbalizes understanding. 

## 2017-03-24 NOTE — Telephone Encounter (Signed)
Sent in azithromycin for bronchitis if coughing not improving over next few days.  Should cont sinus rinses, flonase, antihistamine for nasal congestion. If wheezing continues we can try nebulizer machine and albuterol, tends to be cheaper than the inhaler but less convenient.

## 2017-03-24 NOTE — Addendum Note (Signed)
Addended by: Eustaquio Maize on: 03/24/2017 10:37 AM   Modules accepted: Orders

## 2017-03-24 NOTE — Telephone Encounter (Signed)
Pt called still feels horrible. Wasn't able to get the inhaler rx yesterday b/c she couldn't afford it. She still has yellow mucous from nose, wheezing, chest congestion, can't breathe due to all congestion in chest. Patient would like to have antibiotic called to [facility].

## 2017-03-27 ENCOUNTER — Telehealth: Payer: Self-pay | Admitting: Pediatrics

## 2017-03-27 DIAGNOSIS — R062 Wheezing: Secondary | ICD-10-CM

## 2017-03-27 MED ORDER — ALBUTEROL SULFATE (2.5 MG/3ML) 0.083% IN NEBU: 2.5000 mg | INHALATION_SOLUTION | Freq: Four times a day (QID) | RESPIRATORY_TRACT | 1 refills | Status: DC | PRN

## 2017-03-27 NOTE — Telephone Encounter (Signed)
Needs to be seen, if getting worse may need steroids.

## 2017-03-27 NOTE — Telephone Encounter (Signed)
appt made

## 2017-03-27 NOTE — Telephone Encounter (Signed)
I called pt - we discussed the albuterol - her grandmother had an alb inhaler she had never used = she gave it to her. 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  is there anything else we can do or add to her regimen.

## 2017-03-28 ENCOUNTER — Telehealth: Payer: Self-pay | Admitting: *Deleted

## 2017-03-28 ENCOUNTER — Ambulatory Visit: Payer: BC Managed Care – PPO | Admitting: Family

## 2017-03-28 NOTE — Telephone Encounter (Signed)
Patient aware that nebulizer machine Rx is ready for pick up.

## 2017-03-29 ENCOUNTER — Encounter: Payer: Self-pay | Admitting: Pediatrics

## 2017-03-29 ENCOUNTER — Telehealth: Payer: Self-pay | Admitting: Pediatrics

## 2017-03-29 NOTE — Telephone Encounter (Signed)
Home phone number is not in service and patient's cell phone does not have a voice mail set up.

## 2017-08-01 ENCOUNTER — Encounter: Payer: Self-pay | Admitting: Adult Health

## 2017-08-01 ENCOUNTER — Ambulatory Visit (INDEPENDENT_AMBULATORY_CARE_PROVIDER_SITE_OTHER): Payer: BC Managed Care – PPO | Admitting: Adult Health

## 2017-08-01 VITALS — BP 128/80 | HR 90 | Ht 62.0 in | Wt 138.5 lb

## 2017-08-01 DIAGNOSIS — R102 Pelvic and perineal pain: Secondary | ICD-10-CM | POA: Diagnosis not present

## 2017-08-01 DIAGNOSIS — Z975 Presence of (intrauterine) contraceptive device: Secondary | ICD-10-CM

## 2017-08-01 LAB — POCT URINALYSIS DIPSTICK
Blood, UA: NEGATIVE
Glucose, UA: NEGATIVE
Ketones, UA: NEGATIVE
LEUKOCYTES UA: NEGATIVE
NITRITE UA: NEGATIVE
PROTEIN UA: NEGATIVE

## 2017-08-01 NOTE — Progress Notes (Signed)
Subjective:     Patient ID: Pamela [name], female   DOB: [birth year], 21 y.o.   MRN: [id number]  HPI [name] is a 21 year old white female in complaining of pelvic pain for 1 week, has nexplanon and is sexually active. She describes pain has burning like.   Review of Systems Pelvic pain for 1 week  Reviewed past medical,surgical, social and family history. Reviewed medications and allergies.     Objective:   Physical Exam BP 128/80 (BP Location: Left Arm, Patient Position: Sitting, Cuff Size: Normal)   Pulse 90   Ht 5\' 2"  (1.575 m)   Wt 138 lb 8 oz (62.8 kg)   BMI 25.33 kg/m urine negative,Skin warm and dry.Pelvic: external genitalia is normal in appearance no lesions, vagina: normal in appearance,urethra has no lesions or masses noted, cervix:smooth, no CMT, uterus: normal size, shape and contour, mildly tender, no masses felt, adnexa: no masses,mild LLQ tenderness noted. Bladder is non tender and no masses felt.  GC/CHL obtained.  Will get [place] to assess for possible cyst.    Assessment:     1. Pelvic pain   2. Nexplanon in place       Plan:     GC/CHL sent Return in 2 days for GYN [place]

## 2017-08-03 ENCOUNTER — Other Ambulatory Visit: Payer: Self-pay | Admitting: Adult Health

## 2017-08-03 ENCOUNTER — Ambulatory Visit (INDEPENDENT_AMBULATORY_CARE_PROVIDER_SITE_OTHER): Payer: BC Managed Care – PPO

## 2017-08-03 ENCOUNTER — Telehealth: Payer: Self-pay | Admitting: Adult Health

## 2017-08-03 ENCOUNTER — Encounter: Payer: Self-pay | Admitting: Adult Health

## 2017-08-03 DIAGNOSIS — N83209 Unspecified ovarian cyst, unspecified side: Secondary | ICD-10-CM

## 2017-08-03 DIAGNOSIS — R102 Pelvic and perineal pain: Secondary | ICD-10-CM

## 2017-08-03 DIAGNOSIS — N83201 Unspecified ovarian cyst, right side: Secondary | ICD-10-CM

## 2017-08-03 HISTORY — DX: Unspecified ovarian cyst, unspecified side: N83.209

## 2017-08-03 LAB — GC/CHLAMYDIA PROBE AMP
Chlamydia trachomatis, NAA: NEGATIVE
Neisseria gonorrhoeae by PCR: NEGATIVE

## 2017-08-03 NOTE — Progress Notes (Signed)
PELVIC US TA/TV:homogeneous anteverted uterus,wnl,EEC 3.9 mm,normal left ovary,simple right ovarian cyst 3.6 x 3.3 x 3.8 cm w/arterial and venous flow,no free fluid,ovaries appear mobile,bilat adnexal pain during ultrasound

## 2017-08-03 NOTE — Telephone Encounter (Signed)
Pt aware US showed simple right ovarian cyst, take advil, and will get F/U US in 6 weeks.

## 2017-08-28 ENCOUNTER — Telehealth: Payer: Self-pay | Admitting: *Deleted

## 2017-08-28 NOTE — Telephone Encounter (Signed)
Pt called stating that she has a cyst on her ovary and last night she started having severe pain rated at 7/10. Pt states that she tried taking ibuprofen and tylenol with no relief. Informed pt that she would need to be seen by a provider for pain medication. Provided pt with appt for 10/9 at 830 with [facility].

## 2017-08-29 ENCOUNTER — Ambulatory Visit (INDEPENDENT_AMBULATORY_CARE_PROVIDER_SITE_OTHER): Payer: BC Managed Care – PPO | Admitting: Adult Health

## 2017-08-29 ENCOUNTER — Encounter: Payer: Self-pay | Admitting: Adult Health

## 2017-08-29 VITALS — BP 108/72 | HR 65 | Ht 62.0 in | Wt 136.5 lb

## 2017-08-29 DIAGNOSIS — N83201 Unspecified ovarian cyst, right side: Secondary | ICD-10-CM | POA: Diagnosis not present

## 2017-08-29 DIAGNOSIS — R102 Pelvic and perineal pain: Secondary | ICD-10-CM

## 2017-08-29 DIAGNOSIS — Z975 Presence of (intrauterine) contraceptive device: Secondary | ICD-10-CM | POA: Diagnosis not present

## 2017-08-29 MED ORDER — KETOROLAC TROMETHAMINE 10 MG PO TABS: 10.0000 mg | ORAL_TABLET | Freq: Four times a day (QID) | ORAL | 0 refills | Status: DC | PRN

## 2017-08-29 NOTE — Progress Notes (Signed)
Subjective:     Patient ID: Pamela [name], female   DOB: 17-Sep-1996, 21 y.o.   MRN: [id number]  HPI Pamela [name] is a 21 year old white female in complaining of pelvic pain. L>R has known ovarian cyst on right, and has nexplanon.  Review of Systems Pelvic pain L>R, has known right ovarian cyst  Reviewed past medical,surgical, social and family history. Reviewed medications and allergies.     Objective:   Physical Exam BP 108/72 (BP Location: Left Arm, Patient Position: Sitting, Cuff Size: Normal)   Pulse 65   Ht 5\' 2"  (1.575 m)   Wt 136 lb 8 oz (61.9 kg)   BMI 24.97 kg/m  Skin warm and dry.Pelvic: external genitalia is normal in appearance no lesions, vagina: pink with good moisture,urethra has no lesions or masses noted, cervix:smooth, no CMT, uterus: normal size, shape and contour, slightly tender, no masses felt, adnexa: no masses,+ tenderness noted across, pelvic area, L>R, no rebound.  Bladder is non tender and no masses felt. She declines STD testing.     Assessment:     1. Pelvic pain   2. Cyst of right ovary   3. Nexplanon in place       Plan:     Meds ordered this encounter  Medications  . ketorolac (TORADOL) 10 MG tablet    Sig: Take 1 tablet (10 mg total) by mouth every 6 (six) hours as needed.    Dispense:  20 tablet    Refill:  0    Order Specific Question:   Supervising Provider    Answer:   Florian Buff [[id number]]  Push fluids F/U 10/26 for [place] as scheduled Call me if not better by end of the week, may add megace to see if can suppress ovaries

## 2017-08-29 NOTE — Patient Instructions (Addendum)
Push fluids  Take toradol  F/U 10/26 for [place]  Call me if not better by end of week

## 2017-09-13 ENCOUNTER — Other Ambulatory Visit: Payer: Self-pay | Admitting: Adult Health

## 2017-09-13 DIAGNOSIS — N83201 Unspecified ovarian cyst, right side: Secondary | ICD-10-CM

## 2017-09-14 ENCOUNTER — Ambulatory Visit (INDEPENDENT_AMBULATORY_CARE_PROVIDER_SITE_OTHER): Payer: BC Managed Care – PPO

## 2017-09-14 ENCOUNTER — Other Ambulatory Visit: Payer: Self-pay | Admitting: Adult Health

## 2017-09-14 DIAGNOSIS — N83202 Unspecified ovarian cyst, left side: Secondary | ICD-10-CM

## 2017-09-14 DIAGNOSIS — N83201 Unspecified ovarian cyst, right side: Secondary | ICD-10-CM | POA: Diagnosis not present

## 2017-09-14 NOTE — Progress Notes (Signed)
PELVIC US TA/TV: homogeneous anteverted uterus,wnl, EEC 3.9 mm,normal right ovary (resolved right ov cyst),simple left ovarian cyst 4.7 x 2.5 x 2.7 cm,no free fluid,ovaries appear mobile

## 2017-09-15 ENCOUNTER — Telehealth: Payer: Self-pay | Admitting: Adult Health

## 2017-09-15 NOTE — Telephone Encounter (Signed)
Left message that US showed resolved right ovarina cyst now has simple cyst left ovary, which is probably the cause of pain, call me back if any questions

## 2017-10-05 ENCOUNTER — Ambulatory Visit: Payer: BC Managed Care – PPO | Admitting: Family

## 2017-10-05 ENCOUNTER — Encounter: Payer: Self-pay | Admitting: Family

## 2017-10-05 VITALS — BP 116/70 | HR 78 | Temp 98.3°F | Ht 62.0 in | Wt 134.0 lb

## 2017-10-05 DIAGNOSIS — F419 Anxiety disorder, unspecified: Secondary | ICD-10-CM

## 2017-10-05 DIAGNOSIS — F4310 Post-traumatic stress disorder, unspecified: Secondary | ICD-10-CM

## 2017-10-05 MED ORDER — BUSPIRONE HCL 10 MG PO TABS: 10.0000 mg | ORAL_TABLET | Freq: Three times a day (TID) | ORAL | 1 refills | Status: DC

## 2017-10-05 MED ORDER — VORTIOXETINE HBR 10 MG PO TABS: 10.0000 mg | ORAL_TABLET | Freq: Every day | ORAL | 6 refills | Status: DC
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
## 2017-10-05 NOTE — Patient Instructions (Signed)

## 2017-10-24 ENCOUNTER — Encounter: Payer: Self-pay | Admitting: Family Medicine

## 2017-10-24 ENCOUNTER — Telehealth: Payer: Self-pay

## 2017-10-24 ENCOUNTER — Ambulatory Visit (INDEPENDENT_AMBULATORY_CARE_PROVIDER_SITE_OTHER): Payer: BC Managed Care – PPO | Admitting: Family Medicine

## 2017-10-24 VITALS — BP 114/77 | HR 84 | Temp 97.4°F | Ht 62.0 in | Wt 130.0 lb

## 2017-10-24 DIAGNOSIS — J0110 Acute frontal sinusitis, unspecified: Secondary | ICD-10-CM

## 2017-10-24 MED ORDER — AZITHROMYCIN 250 MG PO TABS: ORAL_TABLET | ORAL | 0 refills | Status: DC

## 2017-10-24 MED ORDER — BUPROPION HCL ER (XL) 150 MG PO TB24
150.0000 mg | ORAL_TABLET | Freq: Every day | ORAL | 1 refills | Status: DC
Start: 2017-10-24 — End: 2018-01-22

## 2017-10-24 MED ORDER — ALBUTEROL SULFATE HFA 108 (90 BASE) MCG/ACT IN AERS: 2.0000 | INHALATION_SPRAY | Freq: Four times a day (QID) | RESPIRATORY_TRACT | 0 refills | Status: DC | PRN

## 2017-10-24 NOTE — Telephone Encounter (Signed)
Patient aware. Apt changed.

## 2017-10-24 NOTE — Telephone Encounter (Signed)
Wellbutrin Prescription sent to pharmacy. She needs to push back her appt 6 weeks

## 2017-10-24 NOTE — Progress Notes (Signed)
BP 114/77   Pulse 84   Temp (!) 97.4 F (36.3 C) (Oral)   Ht 5\' 2"  (1.575 m)   Wt 130 lb (59 kg)   BMI 23.78 kg/m    Subjective:    Patient ID: Pamela [name], female    DOB: 01/03/1996, 21 y.o.   MRN: [id number]  HPI: Pamela [name] is a 21 y.o. female presenting on 10/24/2017 for Sinusitis (x 1 week, nasal congrestion, sinus pressure and headache,bilateral ear pain; using OTC meds)   HPI Cough and sinus congestion Patient has been having 1 week worth of cough and nasal congestion and sinus pressure that has led to chest congestion and the night before last she was having sweats and felt like she was having fevers but did not take the actual temperature.  She denies any shortness of breath or wheezing.  Her cough is been productive of phlegm.  She says her boyfriend was sick prior to her but he was able to clear it but he is not a smoker and she is a smoker.  She has been using Tylenol and Alka-Seltzer which helps some but she still feels like she is getting worse.  Relevant past medical, surgical, family and social history reviewed and updated as indicated. Interim medical history since our last visit reviewed. Allergies and medications reviewed and updated.  Review of Systems  Constitutional: Negative for chills and fever.  HENT: Positive for congestion, ear pain, postnasal drip, rhinorrhea, sinus pressure, sneezing and sore throat. Negative for ear discharge.   Eyes: Negative for pain, redness and visual disturbance.  Respiratory: Positive for cough. Negative for chest tightness, shortness of breath and wheezing.   Cardiovascular: Negative for chest pain and leg swelling.  Genitourinary: Negative for difficulty urinating and dysuria.  Musculoskeletal: Negative for back pain and gait problem.  Skin: Negative for rash.  Neurological: Negative for light-headedness and headaches.  Psychiatric/Behavioral: Negative for agitation and behavioral problems.  All other systems reviewed  and are negative.   Per HPI unless specifically indicated above     Objective:    BP 114/77   Pulse 84   Temp (!) 97.4 F (36.3 C) (Oral)   Ht 5\' 2"  (1.575 m)   Wt 130 lb (59 kg)   BMI 23.78 kg/m   Wt Readings from Last 3 Encounters:  10/24/17 130 lb (59 kg)  10/05/17 134 lb (60.8 kg)  08/29/17 136 lb 8 oz (61.9 kg)    Physical Exam  Constitutional: She is oriented to person, place, and time. She appears well-developed and well-nourished. No distress.  HENT:  Right Ear: Tympanic membrane, external ear and ear canal normal.  Left Ear: Tympanic membrane, external ear and ear canal normal.  Nose: Mucosal edema and rhinorrhea present. No epistaxis. Right sinus exhibits no maxillary sinus tenderness and no frontal sinus tenderness. Left sinus exhibits no maxillary sinus tenderness and no frontal sinus tenderness.  Mouth/Throat: Uvula is midline and mucous membranes are normal. Posterior oropharyngeal edema and posterior oropharyngeal erythema present. No oropharyngeal exudate or tonsillar abscesses.  Eyes: Conjunctivae and EOM are normal.  Neck: Neck supple. No thyromegaly present.  Cardiovascular: Normal rate, regular rhythm, normal heart sounds and intact distal pulses.  No murmur heard. Pulmonary/Chest: Effort normal and breath sounds normal. No respiratory distress. She has no wheezes. She has no rales.  Musculoskeletal: Normal range of motion.  Lymphadenopathy:    She has no cervical adenopathy.  Neurological: She is alert and oriented to person, place, and time. Coordination  normal.  Skin: Skin is warm and dry. No rash noted. She is not diaphoretic.  Psychiatric: She has a normal mood and affect. Her behavior is normal.  Vitals reviewed.       Assessment & Plan:   Problem List Items Addressed This Visit    None    Visit Diagnoses    Acute non-recurrent frontal sinusitis    -  Primary   Relevant Medications   azithromycin (ZITHROMAX) 250 MG tablet   albuterol  (PROVENTIL HFA;VENTOLIN HFA) 108 (90 Base) MCG/ACT inhaler       Follow up plan: Return if symptoms worsen or fail to improve.  Counseling provided for all of the vaccine components No orders of the defined types were placed in this encounter.   Caryl Pina, MD [facility] 10/24/2017, 1:24 PM

## 2017-10-24 NOTE — Telephone Encounter (Signed)
Patient has been unable to take the Trintellix, caused nausea and vomiting.  Do you recommend anything else?  She has a follow up appointment the end of December.

## 2017-11-17 ENCOUNTER — Ambulatory Visit: Payer: BC Managed Care – PPO | Admitting: Family

## 2017-11-22 ENCOUNTER — Ambulatory Visit: Payer: BC Managed Care – PPO | Admitting: Pediatrics

## 2017-11-22 NOTE — Progress Notes (Deleted)
Psychiatric Initial Adult Assessment   Patient Identification: Pamela [name] MRN:  [id number] Date of Evaluation:  11/22/2017 Referral Source: Sharion Balloon, FNP Chief Complaint:   Visit Diagnosis: No diagnosis found.  History of Present Illness:   Pamela [name] is a 22 year old female with anxiety, PTSD, who is referred for anxiety.    Associated Signs/Symptoms: Depression Symptoms:  {DEPRESSION SYMPTOMS:20000} (Hypo) Manic Symptoms:  {BHH MANIC SYMPTOMS:22872} Anxiety Symptoms:  {BHH ANXIETY SYMPTOMS:22873} Psychotic Symptoms:  {BHH PSYCHOTIC SYMPTOMS:22874} PTSD Symptoms: {BHH PTSD SYMPTOMS:22875}  Past Psychiatric History:  Outpatient:  Psychiatry admission:  Previous suicide attempt:  Past trials of medication: lexapro, prozac, trintellix History of violence:   Previous Psychotropic Medications: {YES/NO:21197}  Substance Abuse History in the last 12 months:  {yes no:314532}  Consequences of Substance Abuse: {BHH CONSEQUENCES OF SUBSTANCE ABUSE:22880}  Past Medical History:  Past Medical History:  Diagnosis Date  . Bad odor of urine 08/21/2015  . BV (bacterial vaginosis) 01/28/2015  . Dysmenorrhea 10/14/2014  . History of chlamydia 09/22/2015  . History of UTI 09/22/2015  . Menorrhagia with regular cycle 10/14/2014  . Nexplanon in place 08/21/2015  . Ovarian cyst 08/03/2017  . UTI (lower urinary tract infection) 08/21/2015  . Vaginal discharge 12/11/2014  . Yeast infection 12/11/2014    Past Surgical History:  Procedure Laterality Date  . TONSILLECTOMY AND ADENOIDECTOMY    . WISDOM TOOTH EXTRACTION      Family Psychiatric History: ***  Family History:  Family History  Problem Relation Age of Onset  . Colitis Mother   . Neuropathy Mother   . Cancer Maternal Grandmother        rectal  . Diabetes Paternal Grandmother   . Hypertension Paternal Grandmother   . COPD Paternal Grandfather   . Hypertension Paternal Grandfather     Social History:   Social  History   Socioeconomic History  . Marital status: Single    Spouse name: Not on file  . Number of children: Not on file  . Years of education: Not on file  . Highest education level: Not on file  Social Needs  . Financial resource strain: Not on file  . Food insecurity - worry: Not on file  . Food insecurity - inability: Not on file  . Transportation needs - medical: Not on file  . Transportation needs - non-medical: Not on file  Occupational History  . Not on file  Tobacco Use  . Smoking status: Current Every Day Smoker    Years: 4.00    Types: Cigarettes  . Smokeless tobacco: Never Used  . Tobacco comment: smokes 10 cig daily  Substance and Sexual Activity  . Alcohol use: No  . Drug use: No  . Sexual activity: Yes    Birth control/protection: Implant  Other Topics Concern  . Not on file  Social History Narrative  . Not on file    Additional Social History: ***  Allergies:   Allergies  Allergen Reactions  . Penicillins Other (See Comments)    Don't respond to PCN.     Metabolic Disorder Labs: No results found for: HGBA1C, MPG No results found for: PROLACTIN No results found for: CHOL, TRIG, HDL, CHOLHDL, VLDL, LDLCALC   Current Medications: Current Outpatient Medications  Medication Sig Dispense Refill  . albuterol (PROVENTIL HFA;VENTOLIN HFA) 108 (90 Base) MCG/ACT inhaler Inhale 2 puffs into the lungs every 6 (six) hours as needed for wheezing or shortness of breath. 1 Inhaler 0  . azithromycin (ZITHROMAX) 250 MG tablet  Take 2 the first day and then one each day after. 6 tablet 0  . buPROPion (WELLBUTRIN XL) 150 MG 24 hr tablet Take 1 tablet (150 mg total) by mouth daily. 90 tablet 1  . busPIRone (BUSPAR) 10 MG tablet Take 1 tablet (10 mg total) 3 (three) times daily by mouth. 90 tablet 1  . etonogestrel (NEXPLANON) 68 MG IMPL implant 1 each by Subdermal route once.     No current facility-administered medications for this visit.      Neurologic: Headache: No Seizure: No Paresthesias:No  Musculoskeletal: Strength & Muscle Tone: within normal limits Gait & Station: normal Patient leans: N/A  Psychiatric Specialty Exam: ROS  There were no vitals taken for this visit.There is no height or weight on file to calculate BMI.  General Appearance: Fairly Groomed  Eye Contact:  Good  Speech:  Clear and Coherent  Volume:  Normal  Mood:  {BHH MOOD:22306}  Affect:  {Affect (PAA):22687}  Thought Process:  Coherent and Goal Directed  Orientation:  Full (Time, Place, and Person)  Thought Content:  Logical  Suicidal Thoughts:  {ST/HT (PAA):22692}  Homicidal Thoughts:  {ST/HT (PAA):22692}  Memory:  Immediate;   Good Recent;   Good Remote;   Good  Judgement:  {Judgement (PAA):22694}  Insight:  {Insight (PAA):22695}  Psychomotor Activity:  Normal  Concentration:  Concentration: Good and Attention Span: Good  Recall:  Good  Fund of Knowledge:Good  Language: Good  Akathisia:  No  Handed:  Right  AIMS (if indicated):  ***  Assets:  Communication Skills Desire for Improvement  ADL's:  Intact  Cognition: WNL  Sleep:  ***   Assessment  Plan  The patient demonstrates the following risk factors for suicide: Chronic risk factors for suicide include: {Chronic Risk Factors for YYTKPTW:65681275}. Acute risk factors for suicide include: {Acute Risk Factors for TZGYFVC:94496759}. Protective factors for this patient include: {Protective Factors for Suicide FMBW:46659935}. Considering these factors, the overall suicide risk at this point appears to be {Desc; low/moderate/high:110033}. Patient {ACTION; IS/IS TSV:77939030} appropriate for outpatient follow up.   Treatment Plan Summary: Plan as above   Norman Clay, MD 1/2/201910:08 AM

## 2017-11-24 ENCOUNTER — Ambulatory Visit (HOSPITAL_COMMUNITY): Payer: Self-pay | Admitting: Psychiatry

## 2017-12-05 ENCOUNTER — Ambulatory Visit: Payer: Self-pay | Admitting: Family

## 2017-12-06 ENCOUNTER — Encounter: Payer: Self-pay | Admitting: Family

## 2018-01-09 ENCOUNTER — Encounter: Payer: Self-pay | Admitting: Adult Health

## 2018-01-09 ENCOUNTER — Ambulatory Visit (INDEPENDENT_AMBULATORY_CARE_PROVIDER_SITE_OTHER): Payer: BC Managed Care – PPO | Admitting: Adult Health

## 2018-01-09 VITALS — BP 102/60 | HR 78 | Ht 62.0 in | Wt 132.0 lb

## 2018-01-09 DIAGNOSIS — Z975 Presence of (intrauterine) contraceptive device: Secondary | ICD-10-CM | POA: Diagnosis not present

## 2018-01-09 DIAGNOSIS — Z3009 Encounter for other general counseling and advice on contraception: Secondary | ICD-10-CM

## 2018-01-09 NOTE — Progress Notes (Signed)
Subjective:     Patient ID: Pamela [name], female   DOB: [birth year], 22 y.o.   MRN: [id number]  HPI Pamela [name] is a 22 year old white female in for nexplanon removal and reinsertion and is not on period and had sex yesterday, and nexplanon was due to be removed 12/24/17.  Review of Systems +sex yesterday Reviewed past medical,surgical, social and family history. Reviewed medications and allergies.     Objective:   Physical Exam BP 102/60 (BP Location: Right Arm, Patient Position: Sitting, Cuff Size: Normal)   Pulse 78   Ht 5\' 2"  (8.588 m)   Wt 132 lb (59.9 kg)   LMP 12/26/2017 (Exact Date)   BMI 24.14 kg/m Had sex yesterday, will not remove or reinsert today, no sex and check QHCG 3/4 and remove and reinsert nexplanon in pm.    Assessment:     1. General counseling and advice on contraceptive management   2. Nexplanon in place       Plan:     NO sex Return 3/4 in am for stat [facility] and then pm for removal and reinsertion of nexplanon

## 2018-01-22 ENCOUNTER — Ambulatory Visit: Payer: BC Managed Care – PPO | Admitting: Adult Health

## 2018-01-22 ENCOUNTER — Encounter: Payer: Self-pay | Admitting: Adult Health

## 2018-01-22 ENCOUNTER — Other Ambulatory Visit: Payer: BC Managed Care – PPO

## 2018-01-22 VITALS — BP 98/62 | HR 72 | Ht 62.0 in | Wt 135.0 lb

## 2018-01-22 DIAGNOSIS — Z30017 Encounter for initial prescription of implantable subdermal contraceptive: Secondary | ICD-10-CM

## 2018-01-22 DIAGNOSIS — Z3049 Encounter for surveillance of other contraceptives: Secondary | ICD-10-CM | POA: Diagnosis not present

## 2018-01-22 DIAGNOSIS — Z3046 Encounter for surveillance of implantable subdermal contraceptive: Secondary | ICD-10-CM

## 2018-01-22 LAB — BETA HCG QUANT (REF LAB): hCG Quant: <1 m[IU]/mL

## 2018-01-22 MED ORDER — ETONOGESTREL 68 MG ~~LOC~~ IMPL
68.0000 mg | DRUG_IMPLANT | Freq: Once | SUBCUTANEOUS | Status: AC
  Administered 2018-01-22: 68 mg via SUBCUTANEOUS

## 2018-01-22 NOTE — Addendum Note (Signed)
Addended by: Linton Rump on: 01/22/2018 04:26 PM   Modules accepted: Orders

## 2018-01-22 NOTE — Patient Instructions (Signed)
Use condoms x 2 weeks, keep clean and dry x 24 hours, no heavy lifting, keep steri strips on x 72 hours, Keep pressure dressing on x 24 hours. Follow up prn problems.  

## 2018-01-22 NOTE — Progress Notes (Signed)
Subjective:     Patient ID: Pamela [name], female   DOB: [birth year], 22 y.o.   MRN: [id number]  HPI Pamela [name] is a 22 year old white female in for nexplanon removal and reinsertion.   Review of Systems For nexplanon removal and reinsertion Reviewed past medical,surgical, social and family history. Reviewed medications and allergies.     Objective:   Physical Exam BP 98/62 (BP Location: Right Arm, Patient Position: Sitting, Cuff Size: Normal)   Pulse 72   Ht 5\' 2"  (1.575 m)   Wt 135 lb (61.2 kg)   LMP 12/26/2017 (Exact Date)   BMI 24.69 kg/m QHCG <1,this am. Consent signed for removal and reinsertion of nexplanon, Time out called. Left arm cleansed with betadine, and injected with 1.5 cc 1% lidocaine and waited til numb.Under sterile technique a #11 blade was used to make small vertical incision, and a curved forceps was used to easily remove rod.And new rod inserted,steri strips applied, rod easily palpated by provider and pt.Pressure dressing applied.     Assessment:     1. Encounter for Nexplanon removal   2. Nexplanon insertion   lot # [id number] exp [id number]    Plan:     Use condoms x 2 weeks, keep clean and dry x 24 hours, no heavy lifting, keep steri strips on x 72 hours, Keep pressure dressing on x 24 hours. Follow up prn problems. Return in 5 months for pap and physical  3 years for removal of nexplanon

## 2018-03-07 ENCOUNTER — Encounter: Payer: BC Managed Care – PPO | Admitting: Women's Health

## 2018-05-11 ENCOUNTER — Encounter: Payer: Self-pay | Admitting: Obstetrics & Gynecology

## 2018-05-11 ENCOUNTER — Other Ambulatory Visit: Payer: Self-pay

## 2018-05-11 ENCOUNTER — Ambulatory Visit: Payer: BC Managed Care – PPO | Admitting: Obstetrics & Gynecology

## 2018-05-11 VITALS — BP 108/66 | HR 74 | Ht 63.0 in | Wt 133.0 lb

## 2018-05-11 DIAGNOSIS — N943 Premenstrual tension syndrome: Secondary | ICD-10-CM

## 2018-05-11 DIAGNOSIS — N938 Other specified abnormal uterine and vaginal bleeding: Secondary | ICD-10-CM | POA: Diagnosis not present

## 2018-05-11 MED ORDER — DESOGESTREL-ETHINYL ESTRADIOL 0.15-30 MG-MCG PO TABS: 1.0000 | ORAL_TABLET | Freq: Every day | ORAL | 11 refills | Status: DC

## 2018-05-11 NOTE — Progress Notes (Signed)
Chief Complaint  Patient presents with  . Contraception    issues with nexplanon      22 y.o. G0P0000 No LMP recorded. Patient has had an implant. The current method of family planning is Nexplanon.  Outpatient Encounter Medications as of 05/11/2018  Medication Sig  . etonogestrel (NEXPLANON) 68 MG IMPL implant 1 each by Subdermal route once.  . desogestrel-ethinyl estradiol (APRI,EMOQUETTE,SOLIA) 0.15-30 MG-MCG tablet Take 1 tablet by mouth daily.   No facility-administered encounter medications on file as of 05/11/2018.     Subjective Pamela [name] had her Nexplanon replaced and since then she has had DUB and emotional lability which she did not experience with her previous nexplanon Crying spells short tempered things that don't normally bother her bother her Her bleeding is brown Past Medical History:  Diagnosis Date  . Bad odor of urine 08/21/2015  . BV (bacterial vaginosis) 01/28/2015  . Dysmenorrhea 10/14/2014  . History of chlamydia 09/22/2015  . History of UTI 09/22/2015  . Menorrhagia with regular cycle 10/14/2014  . Nexplanon in place 08/21/2015  . Ovarian cyst 08/03/2017  . UTI (lower urinary tract infection) 08/21/2015  . Vaginal discharge 12/11/2014  . Yeast infection 12/11/2014    Past Surgical History:  Procedure Laterality Date  . TONSILLECTOMY AND ADENOIDECTOMY    . WISDOM TOOTH EXTRACTION      OB History    Gravida  0   Para  0   Term  0   Preterm  0   AB  0   Living  0     SAB  0   TAB  0   Ectopic  0   Multiple  0   Live Births              Allergies  Allergen Reactions  . Penicillins Other (See Comments)    Don't respond to PCN.     Social History   Socioeconomic History  . Marital status: Single    Spouse name: Not on file  . Number of children: Not on file  . Years of education: Not on file  . Highest education level: Not on file  Occupational History  . Not on file  Social Needs  . Financial resource  strain: Not on file  . Food insecurity:    Worry: Not on file    Inability: Not on file  . Transportation needs:    Medical: Not on file    Non-medical: Not on file  Tobacco Use  . Smoking status: Current Every Day Smoker    Packs/day: 0.50    Years: 4.00    Pack years: 2.00    Types: Cigarettes  . Smokeless tobacco: Never Used  . Tobacco comment: smokes 10 cig daily  Substance and Sexual Activity  . Alcohol use: No  . Drug use: No  . Sexual activity: Yes    Birth control/protection: Implant  Lifestyle  . Physical activity:    Days per week: Not on file    Minutes per session: Not on file  . Stress: Not on file  Relationships  . Social connections:    Talks on phone: Not on file    Gets together: Not on file    Attends religious service: Not on file    Active member of club or organization: Not on file    Attends meetings of clubs or organizations: Not on file    Relationship status: Not on file  Other Topics Concern  .  Not on file  Social History Narrative  . Not on file    Family History  Problem Relation Age of Onset  . Colitis Mother   . Neuropathy Mother   . Cancer Maternal Grandmother        rectal  . Diabetes Paternal Grandmother   . Hypertension Paternal Grandmother   . COPD Paternal Grandfather   . Hypertension Paternal Grandfather     Medications:       Current Outpatient Medications:  .  etonogestrel (NEXPLANON) 68 MG IMPL implant, 1 each by Subdermal route once., Disp: , Rfl:  .  desogestrel-ethinyl estradiol (APRI,EMOQUETTE,SOLIA) 0.15-30 MG-MCG tablet, Take 1 tablet by mouth daily., Disp: 1 Package, Rfl: 11  Objective Blood pressure 108/66, pulse 74, height 5\' 3"  (1.6 m), weight 133 lb (60.3 kg).  Gen WDWN NAD  Pertinent ROS No burning with urination, frequency or urgency No nausea, vomiting or diarrhea Nor fever chills or other constitutional symptoms   Labs or studies No new    Impression Diagnoses this Encounter::   ICD-10-CM    1. DUB (dysfunctional uterine bleeding) N93.8   2. PMS (premenstrual syndrome) on nexplanon N94.3     Established relevant diagnosis(es):   Plan/Recommendations: Meds ordered this encounter  Medications  . desogestrel-ethinyl estradiol (APRI,EMOQUETTE,SOLIA) 0.15-30 MG-MCG tablet    Sig: Take 1 tablet by mouth daily.    Dispense:  1 Package    Refill:  11    Labs or Scans Ordered: No orders of the defined types were placed in this encounter.   Management:: Will supplemet with 30 microgram OCP for her DUB and her emotional lability for 3 months and see if that transitions her with better results  Follow up Return if symptoms worsen or fail to improve.        Face to face time:  15 minutes  Greater than 50% of the visit time was spent in counseling and coordination of care with the patient.  The summary and outline of the counseling and care coordination is summarized in the note above.   All questions were answered.

## 2018-06-25 ENCOUNTER — Other Ambulatory Visit: Payer: BC Managed Care – PPO | Admitting: Adult Health

## 2018-08-13 ENCOUNTER — Encounter: Payer: Self-pay | Admitting: Adult Health

## 2018-08-13 ENCOUNTER — Other Ambulatory Visit (HOSPITAL_COMMUNITY)
Admission: RE | Admit: 2018-08-13 | Discharge: 2018-08-13 | Disposition: A | Discharge status: Home / Self Care | Payer: BC Managed Care – PPO | Source: Ambulatory Visit | Attending: Adult Health | Admitting: Adult Health

## 2018-08-13 ENCOUNTER — Ambulatory Visit (INDEPENDENT_AMBULATORY_CARE_PROVIDER_SITE_OTHER): Payer: BC Managed Care – PPO | Admitting: Adult Health

## 2018-08-13 VITALS — BP 129/87 | HR 81 | Ht 63.0 in | Wt 139.0 lb

## 2018-08-13 DIAGNOSIS — Z8742 Personal history of other diseases of the female genital tract: Secondary | ICD-10-CM | POA: Insufficient documentation

## 2018-08-13 DIAGNOSIS — Z01419 Encounter for gynecological examination (general) (routine) without abnormal findings: Secondary | ICD-10-CM | POA: Diagnosis not present

## 2018-08-13 DIAGNOSIS — Z975 Presence of (intrauterine) contraceptive device: Secondary | ICD-10-CM

## 2018-08-13 DIAGNOSIS — R102 Pelvic and perineal pain: Secondary | ICD-10-CM

## 2018-08-13 NOTE — Progress Notes (Signed)
Patient ID: Pamela [name], female   DOB: 1996/03/06, 22 y.o.   MRN: [id number] History of Present Illness: Pamela [name] is a 22 year old white female, single, G0P0, in for a well woman gyn exam and first pap.Has had pelvic pain for last 3-4 days, kinda  burns she says. PCP is Evelina Dun, NP.   Current Medications, Allergies, Past Medical History, Past Surgical History, Family History and Social History were reviewed in [employer] record.     Review of Systems: Patient denies any headaches, hearing loss, fatigue, blurred vision, shortness of breath, chest pain, abdominal pain, problems with bowel movements, urination, or intercourse(hurts sometimes). No joint pain or mood swings. +pelvic pain,burns for last 3-4 days    Physical Exam:BP 129/87 (BP Location: Left Arm, Patient Position: Sitting, Cuff Size: Normal)   Pulse 81   Ht 5\' 3"  (1.6 m)   Wt 139 lb (63 kg)   BMI 24.62 kg/m  General:  Well developed, well nourished, no acute distress Skin:  Warm and dry Neck:  Midline trachea, normal thyroid, good ROM, no lymphadenopathy Lungs; Clear to auscultation bilaterally Breast:  No dominant palpable mass, retraction, or nipple discharge Cardiovascular: Regular rate and rhythm Abdomen:  Soft, non tender, no hepatosplenomegaly Pelvic:  External genitalia is normal in appearance, no lesions.  The vagina is normal in appearance. Urethra has no lesions or masses. The cervix is nulliparous, pap with CG/CHL performed.  Uterus is felt to be normal size, shape, and contour.  No adnexal masses, + tenderness noted, across lower pelvic area.Bladder is non tender, no masses felt. Extremities/musculoskeletal:  No swelling or varicosities noted, no clubbing or cyanosis Psych:  No mood changes, alert and cooperative,seems happy PHQ 2 score 0. Examination chaperoned by Estill Bamberg Rash LPN. Will get [place] to assess pain.  Impression: 1. Encounter for gynecological examination with  Papanicolaou smear of cervix   2. Nexplanon in place   3. Pelvic pain   4. History of ovarian cyst       Plan: Pap with GC/CHL sent  Return in 1 week for GYN [place] Physical in 1 year Pap in 3 if normal

## 2018-08-15 LAB — CYTOLOGY - PAP
Chlamydia: NEGATIVE
Diagnosis: NEGATIVE
Neisseria Gonorrhea: NEGATIVE

## 2018-08-20 ENCOUNTER — Other Ambulatory Visit: Payer: Self-pay | Admitting: Adult Health

## 2018-08-20 ENCOUNTER — Ambulatory Visit (INDEPENDENT_AMBULATORY_CARE_PROVIDER_SITE_OTHER): Payer: BC Managed Care – PPO

## 2018-08-20 DIAGNOSIS — Z8742 Personal history of other diseases of the female genital tract: Secondary | ICD-10-CM

## 2018-08-20 DIAGNOSIS — R102 Pelvic and perineal pain: Secondary | ICD-10-CM | POA: Diagnosis not present

## 2018-08-20 DIAGNOSIS — N83201 Unspecified ovarian cyst, right side: Secondary | ICD-10-CM

## 2018-08-20 NOTE — Progress Notes (Signed)
PELVIC US TA/TV: homogeneous anteverted uterus,wnl,EEC 4.3 mm,normal left ovary,simple right ovarian cyst 3.8 x 3.2 x 3.3 cm w/arterial and venous flow,no free fluid,ovaries appear mobile,pelvic pain during ultrasound

## 2018-08-21 ENCOUNTER — Telehealth: Payer: Self-pay | Admitting: Adult Health

## 2018-08-21 NOTE — Telephone Encounter (Signed)
Pt aware that there is no cyst left ovary and simple cyst right ovary

## 2018-12-26 ENCOUNTER — Ambulatory Visit: Payer: BC Managed Care – PPO | Admitting: Family Medicine

## 2018-12-26 ENCOUNTER — Other Ambulatory Visit: Payer: Self-pay | Admitting: *Deleted

## 2018-12-27 ENCOUNTER — Ambulatory Visit: Payer: BC Managed Care – PPO | Admitting: Family Medicine

## 2018-12-27 ENCOUNTER — Encounter: Payer: Self-pay | Admitting: Family Medicine

## 2018-12-27 VITALS — BP 104/64 | HR 79 | Temp 98.6°F | Ht 63.0 in | Wt 141.0 lb

## 2018-12-27 DIAGNOSIS — R071 Chest pain on breathing: Secondary | ICD-10-CM

## 2018-12-27 DIAGNOSIS — M79602 Pain in left arm: Secondary | ICD-10-CM | POA: Diagnosis not present

## 2018-12-27 DIAGNOSIS — R0789 Other chest pain: Secondary | ICD-10-CM

## 2018-12-27 NOTE — Patient Instructions (Signed)
Your Nexplanon is in place.  I feel that it is intact and in the plane that it should be.  This is reassuring.  The pain that you are experiencing in your rib is called costochondritis.  This is likely related to you lifting up the child.  I recommend that you take 600 mg of ibuprofen every 6 hours with food and plenty of water until your rib pain has subsided.  If you demonstrate any other worrisome symptoms or signs that we discussed, you seek immediate medical attention   Costochondritis  Costochondritis is swelling and irritation (inflammation) of the tissue (cartilage) that connects your ribs to your breastbone (sternum). This causes pain in the front of your chest. The pain usually starts gradually and involves more than one rib. What are the causes? The exact cause of this condition is not always known. It results from stress on the cartilage where your ribs attach to your sternum. The cause of this stress could be:  Chest injury (trauma).  Exercise or activity, such as lifting.  Severe coughing. What increases the risk? You may be at higher risk for this condition if you:  Are female.  Are 80?22 years old.  Recently started a new exercise or work activity.  Have low levels of vitamin D.  Have a condition that makes you cough frequently. What are the signs or symptoms? The main symptom of this condition is chest pain. The pain:  Usually starts gradually and can be sharp or dull.  Gets worse with deep breathing, coughing, or exercise.  Gets better with rest.  May be worse when you press on the sternum-rib connection (tenderness). How is this diagnosed? This condition is diagnosed based on your symptoms, medical history, and a physical exam. Your health care provider will check for tenderness when pressing on your sternum. This is the most important finding. You may also have tests to rule out other causes of chest pain. These may include:  A chest X-ray to check for  lung problems.  An electrocardiogram (ECG) to see if you have a heart problem that could be causing the pain.  An imaging scan to rule out a chest or rib fracture. How is this treated? This condition usually goes away on its own over time. Your health care provider may prescribe an NSAID to reduce pain and inflammation. Your health care provider may also suggest that you:  Rest and avoid activities that make pain worse.  Apply heat or cold to the area to reduce pain and inflammation.  Do exercises to stretch your chest muscles. If these treatments do not help, your health care provider may inject a numbing medicine at the sternum-rib connection to help relieve the pain. Follow these instructions at home:  Avoid activities that make pain worse. This includes any activities that use chest, abdominal, and side muscles.  If directed, put ice on the painful area: ? Put ice in a plastic bag. ? Place a towel between your skin and the bag. ? Leave the ice on for 20 minutes, 2-3 times a day.  If directed, apply heat to the affected area as often as told by your health care provider. Use the heat source that your health care provider recommends, such as a moist heat pack or a heating pad. ? Place a towel between your skin and the heat source. ? Leave the heat on for 20-30 minutes. ? Remove the heat if your skin turns bright red. This is especially important if you  are unable to feel pain, heat, or cold. You may have a greater risk of getting burned.  Take over-the-counter and prescription medicines only as told by your health care provider.  Return to your normal activities as told by your health care provider. Ask your health care provider what activities are safe for you.  Keep all follow-up visits as told by your health care provider. This is important. Contact a health care provider if:  You have chills or a fever.  Your pain does not go away or it gets worse.  You have a cough that  does not go away (is persistent). Get help right away if:  You have shortness of breath. This information is not intended to replace advice given to you by your health care provider. Make sure you discuss any questions you have with your health care provider. Document Released: 08/17/2005 Document Revised: 08/09/2017 Document Reviewed: 03/02/2016 Elsevier Interactive Patient Education  [employer].

## 2018-12-27 NOTE — Progress Notes (Signed)
Subjective: CC: Left upper arm pain   PCP: Sharion Balloon, FNP RKY:[id number] Yaden is a 23 y.o. female presenting to clinic today for:  1. left upper arm pain Patient reports that she was holding something in her arms when she was knocked into and her left upper arm got pinched.  She is worried that her Nexplanon was affected and is here for further evaluation of this today.  No substantial bruising, swelling.  2.  Chest pain Patient also reports right-sided chest pain that started this morning.  She notes that she was lifting a child yesterday but has not done any other unusual activities.  Denies any direct blows to the chest.  She reports that pain seems to be more prominent with taking a deep breath in.  Denies any lower extremity edema, calf swelling or pain.  No hemoptysis, fevers.  No tachycardia.  She has had a mild cough.   ROS: Per HPI  Allergies  Allergen Reactions  . Penicillins Other (See Comments)    Don't respond to PCN.    Past Medical History:  Diagnosis Date  . Bad odor of urine 08/21/2015  . BV (bacterial vaginosis) 01/28/2015  . Dysmenorrhea 10/14/2014  . History of chlamydia 09/22/2015  . History of UTI 09/22/2015  . Menorrhagia with regular cycle 10/14/2014  . Nexplanon in place 08/21/2015  . Ovarian cyst 08/03/2017  . UTI (lower urinary tract infection) 08/21/2015  . Vaginal discharge 12/11/2014  . Yeast infection 12/11/2014    Current Outpatient Medications:  .  etonogestrel (NEXPLANON) 67 MG IMPL implant, 1 each by Subdermal route once., Disp: , Rfl:  Social History   Socioeconomic History  . Marital status: Single    Spouse name: Not on file  . Number of children: Not on file  . Years of education: Not on file  . Highest education level: Not on file  Occupational History  . Not on file  Social Needs  . Financial resource strain: Not on file  . Food insecurity:    Worry: Not on file    Inability: Not on file  . Transportation needs:    Medical:  Not on file    Non-medical: Not on file  Tobacco Use  . Smoking status: Current Every Day Smoker    Packs/day: 0.50    Years: 4.00    Pack years: 2.00    Types: Cigarettes  . Smokeless tobacco: Never Used  . Tobacco comment: smokes 10 cig daily  Substance and Sexual Activity  . Alcohol use: No  . Drug use: No  . Sexual activity: Yes    Birth control/protection: Implant  Lifestyle  . Physical activity:    Days per week: Not on file    Minutes per session: Not on file  . Stress: Not on file  Relationships  . Social connections:    Talks on phone: Not on file    Gets together: Not on file    Attends religious service: Not on file    Active member of club or organization: Not on file    Attends meetings of clubs or organizations: Not on file    Relationship status: Not on file  . Intimate partner violence:    Fear of current or ex partner: Not on file    Emotionally abused: Not on file    Physically abused: Not on file    Forced sexual activity: Not on file  Other Topics Concern  . Not on file  Social History Narrative  .  Not on file   Family History  Problem Relation Age of Onset  . Colitis Mother   . Neuropathy Mother   . Cancer Maternal Grandmother        rectal  . Diabetes Paternal Grandmother   . Hypertension Paternal Grandmother   . COPD Paternal Grandfather   . Hypertension Paternal Grandfather     Objective: Office vital signs reviewed. BP 104/64   Pulse 79   Temp 98.6 F (37 C) (Oral)   Ht 5\' 3"  (1.6 m)   Wt 141 lb (64 kg)   BMI 24.98 kg/m   Physical Examination:  General: Awake, alert, well nourished, No acute distress Cardio: regular rate and rhythm, S1S2 heard, no murmurs appreciated Pulm: clear to auscultation bilaterally, no wheezes, rhonchi or rales; normal work of breathing on room air Extremities: warm, well perfused, No edema, cyanosis or clubbing; +2 pulses bilaterally  LUE: Nexplanon intact and appears to be subdermal as expected.  No  appreciable bruising, fluctuance or induration. MSK: +TTP to the 3rd right rib anteriorly and posteriorly. No palpable bony abnormalties  Assessment/ Plan: 23 y.o. female   1. Costochondral pain Rib pain is likely costochondral pain related to lifting a small child recently.  I have advised her use oral NSAID, ibuprofen 600 mg 4 times daily as needed pain and inflammation.  Handout provided.  Reasons for return and emergent evaluation emergency department discussed.  No evidence of bacterial infection.  Wells score for PE was 0.  2. Pain of left upper extremity No evidence of complication of the Nexplanon.  Appears to be in place.  No substantial soft tissue swelling appreciated.  Reassurance provided.  Okay to use NSAID as above.  Janora Norlander, DO [place] [phone]

## 2019-05-08 ENCOUNTER — Telehealth: Payer: Self-pay | Admitting: Adult Health

## 2019-05-08 NOTE — Telephone Encounter (Signed)
Patient called, stated that she is having pain during intercourse.  She'd like an appointment, please advise.  [phone]

## 2019-05-15 ENCOUNTER — Encounter: Payer: Self-pay | Admitting: Adult Health

## 2019-05-15 ENCOUNTER — Other Ambulatory Visit: Payer: Self-pay

## 2019-05-15 ENCOUNTER — Ambulatory Visit (INDEPENDENT_AMBULATORY_CARE_PROVIDER_SITE_OTHER): Payer: BC Managed Care – PPO | Admitting: Adult Health

## 2019-05-15 VITALS — BP 115/66 | HR 68 | Ht 63.0 in | Wt 149.5 lb

## 2019-05-15 DIAGNOSIS — Z975 Presence of (intrauterine) contraceptive device: Secondary | ICD-10-CM

## 2019-05-15 DIAGNOSIS — N941 Unspecified dyspareunia: Secondary | ICD-10-CM | POA: Diagnosis not present

## 2019-05-15 MED ORDER — URIBEL 118 MG PO CAPS: 1.0000 | ORAL_CAPSULE | Freq: Three times a day (TID) | ORAL | 1 refills | Status: DC

## 2019-05-15 NOTE — Progress Notes (Signed)
Patient ID: Pamela [name], female   DOB: Jul 01, 1996, 23 y.o.   MRN: [id number] History of Present Illness: Pamela [name] is a 23 year old white female, single, G0P0, in complaining of pain after sex and after orgasm too. PCP is Dr Lajuana Ripple.    Current Medications, Allergies, Past Medical History, Past Surgical History, Family History and Social History were reviewed in [employer] record.     Review of Systems: Pain after sex, no pain during, feels sore lower pelvic area Pain after self induced orgasm too Has noticed having to pee more often then and may burn at times    Physical Exam:BP 115/66 (BP Location: Right Arm, Patient Position: Sitting, Cuff Size: Normal)   Pulse 68   Ht 5\' 3"  (1.6 m)   Wt 149 lb 8 oz (67.8 kg)   LMP 05/06/2019 (Approximate)   BMI 26.48 kg/m  General:  Well developed, well nourished, no acute distress Skin:  Warm and dry Pelvic:  External genitalia is normal in appearance, no lesions.  The vagina is normal in appearance. Urethra has no lesions or masses. The cervix is nulliparous, no CMT.  Uterus is felt to be normal size, shape, and contour,mildly tender.  No adnexal masses, +mild tenderness noted on the right.Bladder is + tenderness, no masses felt. Psych:  No mood changes, alert and cooperative,seems happy Fall risk is low. Examination chaperoned by Levy Pupa LPN.  Will try uribel to see if could be IC.  Impression: 1. Dyspareunia, female   2. Nexplanon in place       Plan: Push fluids Wait 3-4 days to have sex  Meds ordered this encounter  Medications  . Meth-Hyo-M Bl-Na Phos-Ph Sal (URIBEL) 118 MG CAPS    Sig: Take 1 capsule (118 mg total) by mouth 3 (three) times daily.    Dispense:  45 capsule    Refill:  1    Order Specific Question:   Supervising Provider    Answer:   Tania Ade H [[id number]]  Follow up in 3 weeks Review handouts on IC and dyspareunia

## 2019-05-15 NOTE — Patient Instructions (Signed)
Interstitial Cystitis  Interstitial cystitis is inflammation of the bladder. This may cause pain in the bladder area as well as a frequent and urgent need to urinate. The bladder is a hollow organ in the lower part of the abdomen. It stores urine after the urine is made in the kidneys. The severity of interstitial cystitis can vary from person to person. You may have flare-ups, and then your symptoms may go away for a while. For many people, it becomes a long-term (chronic) problem. What are the causes? The cause of this condition is not known. What increases the risk? The following factors may make you more likely to develop this condition:  You are female.  You have fibromyalgia.  You have irritable bowel syndrome (IBS).  You have endometriosis. This condition may be aggravated by:  Stress.  Smoking.  Spicy foods. What are the signs or symptoms? Symptoms of interstitial cystitis vary, and they can change over time. Symptoms may include:  Discomfort or pain in the bladder area, which is in the lower abdomen. Pain can range from mild to severe. The pain may change in intensity as the bladder fills with urine or as it empties.  Pain in the pelvic area, between the hip bones.  An urgent need to urinate.  Frequent urination.  Pain during urination.  Pain during sex.  Blood in the urine. For women, symptoms often get worse during menstruation. How is this diagnosed? This condition is diagnosed based on your symptoms, your medical history, and a physical exam. You may have tests to rule out other conditions, such as:  Urine tests.  Cystoscopy. For this test, a tool similar to a very thin telescope is used to look into your bladder.  Biopsy. This involves taking a sample of tissue from the bladder to be examined under a microscope. How is this treated? There is no cure for this condition, but treatment can help you control your symptoms. Work closely with your health care  provider to find the most effective treatments for you. Treatment options may include:  Medicines to relieve pain and reduce how often you feel the need to urinate.  Learning ways to control when you urinate (bladder training).  Lifestyle changes, such as changing your diet or taking steps to control stress.  Using a device that provides electrical stimulation to your nerves, which can relieve pain (neuromodulation therapy). The device is placed on your back, where it blocks the nerves that cause you to feel pain in your bladder area.  A procedure that stretches your bladder by filling it with air or fluid.  Surgery. This is rare. It is only done for extreme cases, if other treatments do not help. Follow these instructions at home: Bladder training   Use bladder training techniques as directed. Techniques may include: ? Urinating at scheduled times. ? Training yourself to delay urination. ? Doing exercises (Kegel exercises) to strengthen the muscles that control urine flow.  Keep a bladder diary. ? Write down the times that you urinate and any symptoms that you have. This can help you find out which foods, liquids, or activities make your symptoms worse. ? Use your bladder diary to schedule bathroom trips. If you are away from home, plan to be near a bathroom at each of your scheduled times.  Make sure that you urinate just before you leave the house and just before you go to bed. Eating and drinking  Make dietary changes as recommended by your health care provider. You   may need to avoid: ? Spicy foods. ? Foods that contain a lot of potassium.  Limit your intake of beverages that make you need to urinate. These include: ? Caffeinated beverages like soda, coffee, and tea. ? Alcohol. General instructions  Take over-the-counter and prescription medicines only as told by your health care provider.  Do not drink alcohol.  You can try a warm or cool compress over your bladder for  comfort.  Avoid wearing tight clothing.  Do not use any products that contain nicotine or tobacco, such as cigarettes and e-cigarettes. If you need help quitting, ask your health care provider.  Keep all follow-up visits as told by your health care provider. This is important. Contact a health care provider if you have:  Symptoms that do not get better with treatment.  Pain or discomfort that gets worse.  More frequent urges to urinate.  A fever. Get help right away if:  You have no control over when you urinate. Summary  Interstitial cystitis is inflammation of the bladder.  This condition may cause pain in the bladder area as well as a frequent and urgent need to urinate.  You may have flare-ups of the condition, and then it may go away for a while. For many people, it becomes a long-term (chronic) problem.  There is no cure for interstitial cystitis, but treatment methods are available to control your symptoms. This information is not intended to replace advice given to you by your health care provider. Make sure you discuss any questions you have with your health care provider. Document Released: 07/08/2004 Document Revised: 10/02/2017 Document Reviewed: 10/02/2017 Elsevier Interactive Patient Education  2019 Else[employer] Inc[employer] Dyspareunia, Female  Dyspareunia is pain that is associated with sexual activity. This can affect any part of the genitals or lower abdomen, and there are many possible causes. This condition ranges from mild to severe. Depending on the cause, dyspareunia may get better with treatment, or it may return (recur) over time. What are the causes? The cause of this condition is not always known. Possible causes include:  Cancer.  Psychological factors, such as depression, anxiety, or previous traumatic experiences.  Severe pain and tenderness of the skin around the vagina (vulva) when it is touched (vulvar vestibulitis syndrome).  Infection of the pelvis  or the vulva.  Infection of the vagina.  Painful, involuntary tightening (contraction) of the vaginal muscles when anything is put inside the vagina (vaginismus).  Allergic reaction.  Ovarian cysts.  Solid growths of tissue (tumors) in the ovaries or the uterus.  Scar tissue in the ovaries, vagina, or pelvis.  Vaginal dryness.  Thinning of the tissue (atrophy) of the vulva and vagina.  Skin conditions that affect the vulva (vulvar dermatoses), such as lichen sclerosus or lichen planus.  Endometriosis.  Tubal pregnancy.  A tilted uterus.  Uterine prolapse.  Adhesions in the vagina.  Bladder problems.  Intestinal problems.  Certain medicines.  Medical conditions such as diabetes, arthritis, or thyroid disease. What increases the risk? The following factors may make you more likely to develop this condition:  Having experienced physical or sexual trauma.  Having given birth more than once.  Taking birth control pills.  Having gone through menopause.  Having recently given birth, typically within the past 3-6 months.  Breastfeeding. What are the signs or symptoms? The main symptom of this condition is pain in any part of the genitals or lower abdomen during or after sexual activity. This may include pain during sexual arousal, genital  stimulation, or orgasm. Pain may get worse when anything is inserted into the vagina, or when the genitals are touched in any way, such as when sitting or wearing pants. Pain can range from mild to severe, depending on the cause of the condition. In some cases, symptoms go away with treatment and return (recur) at a later date. How is this diagnosed? This condition may be diagnosed based on:  Your symptoms, including: ? Where your pain is located. ? When your pain occurs.  Your medical history.  A physical exam. This may include a pelvic exam and a Pap test. This is a screening test that is used to check for signs of cancer of  the vagina, cervix, and uterus.  Tests, including: ? Blood tests. ? Ultrasound. This uses sound waves to make a picture of the area that is being tested. ? Urine culture. This test involves checking a urine sample for signs of infection. ? Culture test. This is when your health care provider uses a swab to collect a sample of vaginal fluid. The sample is checked for signs of infection. ? X-rays. ? MRI. ? CT scan. ? Laparoscopy. This is a procedure in which a small incision is made in your lower abdomen and a lighted, pencil-sized instrument (laparoscope) is passed through the incision and used to look inside your pelvis. You may be referred to a health care provider who specializes in women's health (gynecologist). In some cases, diagnosing the cause of dyspareunia can be difficult. How is this treated? Treatment depends on the cause of your condition and your symptoms. In most cases, you may need to stop sexual activity until your symptoms improve. Treatment may include:  Lubricants.  Kegel exercises or vaginal dilators.  Medicated skin creams.  Medicated vaginal creams.  Hormonal therapy.  Antibiotic medicine to prevent or fight infection.  Medicines that help to relieve pain.  Medicines that treat depression (antidepressants).  Psychological counseling.  Sex therapy.  Surgery. Follow these instructions at home: Lifestyle  Avoid tight clothing and irritating materials around your genital and abdominal area.  Use water-based lubricants as needed. Avoid oil-based lubricants.  Do not use any products that irritate you. This may include certain condoms, spermicides, lubricants, soaps, tampons, vaginal sprays, or douches.  Always practice safe sex. Talk with your health care provider about which form of birth control (contraception) is best for you.  Maintain open communication with your sexual partner. General instructions  Take over-the-counter and prescription  medicines only as told by your health care provider.  If you had tests done, it is your responsibility to get your tests results. Ask your health care provider or the department performing the test when your results will be ready.  Urinate before you engage in sexual activity.  Consider joining a support group.  Keep all follow-up visits as told by your health care provider. This is important. Contact a health care provider if:  You develop vaginal bleeding after sexual intercourse.  You develop a lump at the opening of your vagina. Seek medical care even if the lump is painless.  You have: ? Abnormal vaginal discharge. ? Vaginal dryness. ? Itchiness or irritation of your vulva or vagina. ? A new rash. ? Symptoms that get worse or do not improve with treatment. ? A fever. ? Pain when you urinate. ? Blood in your urine. Get help right away if:  You develop severe pain in your abdomen during or shortly after sexual intercourse.  You pass out after  having sexual intercourse. This information is not intended to replace advice given to you by your health care provider. Make sure you discuss any questions you have with your health care provider. Document Released: 11/27/2007 Document Revised: 03/18/2016 Document Reviewed: 06/09/2015 Elsevier Interactive Patient Education  [employer].

## 2019-05-20 ENCOUNTER — Telehealth: Payer: Self-pay | Admitting: Adult Health

## 2019-05-20 MED ORDER — FLUCONAZOLE 150 MG PO TABS: ORAL_TABLET | ORAL | 1 refills | Status: DC

## 2019-05-20 NOTE — Addendum Note (Signed)
Addended by: Derrek Monaco A on: 05/20/2019 01:45 PM   Modules accepted: Orders

## 2019-05-20 NOTE — Telephone Encounter (Signed)
Patient called, requesting meds for a UTI  [facility]  [phone]

## 2019-05-20 NOTE — Telephone Encounter (Addendum)
Pt called with a light discharge and feels dry down there. Discharge has no odor. Pt is requesting Diflucan. Please advise. Thanks! [place]

## 2019-05-20 NOTE — Telephone Encounter (Signed)
rx diflucan  

## 2019-06-10 ENCOUNTER — Ambulatory Visit: Payer: BC Managed Care – PPO | Admitting: Adult Health

## 2019-06-21 ENCOUNTER — Telehealth: Payer: Self-pay | Admitting: Family Medicine

## 2019-06-21 NOTE — Telephone Encounter (Signed)
Forward to PCP.

## 2019-06-24 NOTE — Telephone Encounter (Signed)
Would she like to be seen to start something for anxiety?

## 2019-06-24 NOTE — Telephone Encounter (Signed)
appt made to discuss anxiety

## 2019-06-27 ENCOUNTER — Telehealth: Payer: Self-pay | Admitting: Family Medicine

## 2019-06-28 ENCOUNTER — Ambulatory Visit: Payer: BC Managed Care – PPO | Admitting: Family Medicine

## 2019-06-28 ENCOUNTER — Encounter: Payer: Self-pay | Admitting: Family Medicine

## 2019-06-28 ENCOUNTER — Other Ambulatory Visit: Payer: Self-pay

## 2019-06-28 VITALS — BP 107/68 | HR 72 | Temp 98.6°F | Ht 63.0 in | Wt 145.0 lb

## 2019-06-28 DIAGNOSIS — F419 Anxiety disorder, unspecified: Secondary | ICD-10-CM

## 2019-06-28 MED ORDER — HYDROXYZINE HCL 25 MG PO TABS: 12.5000 mg | ORAL_TABLET | Freq: Three times a day (TID) | ORAL | 0 refills | Status: DC | PRN

## 2019-06-28 NOTE — Progress Notes (Signed)
Subjective: CC: Panic PCP: Janora Norlander, DO WHQ:PRFFMB Ruedas is a 23 y.o. female presenting to clinic today for:  1.  Panic/anxiety Patient reports longstanding history of anxiety and panic disorder.  She notes that she has been previously treated with Prozac and Trintellix but was unable to remember to take the medication and therefore it was never really effective.  She notes that panic and anxiety are typically well controlled with meditation and self coping but that she has quite a bit of difficulty at work due to needing to wear mask for COVID-19 and the intense heat that she experiences at work.  She notes poor circulation in her work environment which does increase anxiety symptoms.  She is never been placed on a as needed medication but would be interested in this.   ROS: Per HPI  Allergies  Allergen Reactions  . Penicillins Other (See Comments)    Don't respond to PCN.    Past Medical History:  Diagnosis Date  . Bad odor of urine 08/21/2015  . BV (bacterial vaginosis) 01/28/2015  . Dysmenorrhea 10/14/2014  . History of chlamydia 09/22/2015  . History of UTI 09/22/2015  . Menorrhagia with regular cycle 10/14/2014  . Nexplanon in place 08/21/2015  . Ovarian cyst 08/03/2017  . UTI (lower urinary tract infection) 08/21/2015  . Vaginal discharge 12/11/2014  . Yeast infection 12/11/2014    Current Outpatient Medications:  .  etonogestrel (NEXPLANON) 3 MG IMPL implant, 1 each by Subdermal route once., Disp: , Rfl:  Social History   Socioeconomic History  . Marital status: Single    Spouse name: Not on file  . Number of children: Not on file  . Years of education: Not on file  . Highest education level: Not on file  Occupational History  . Not on file  Social Needs  . Financial resource strain: Not on file  . Food insecurity    Worry: Not on file    Inability: Not on file  . Transportation needs    Medical: Not on file    Non-medical: Not on file  Tobacco Use   . Smoking status: Current Every Day Smoker    Packs/day: 0.50    Years: 4.00    Pack years: 2.00    Types: Cigarettes  . Smokeless tobacco: Never Used  . Tobacco comment: smokes 10 cig daily  Substance and Sexual Activity  . Alcohol use: No  . Drug use: No  . Sexual activity: Yes    Birth control/protection: Implant  Lifestyle  . Physical activity    Days per week: Not on file    Minutes per session: Not on file  . Stress: Not on file  Relationships  . Social Herbalist on phone: Not on file    Gets together: Not on file    Attends religious service: Not on file    Active member of club or organization: Not on file    Attends meetings of clubs or organizations: Not on file    Relationship status: Not on file  . Intimate partner violence    Fear of current or ex partner: Not on file    Emotionally abused: Not on file    Physically abused: Not on file    Forced sexual activity: Not on file  Other Topics Concern  . Not on file  Social History Narrative  . Not on file   Family History  Problem Relation Age of Onset  . Colitis Mother   .  Neuropathy Mother   . Cancer Maternal Grandmother        rectal  . Diabetes Paternal Grandmother   . Hypertension Paternal Grandmother   . COPD Paternal Grandfather   . Hypertension Paternal Grandfather     Objective: Office vital signs reviewed. BP 107/68   Pulse 72   Temp 98.6 F (37 C) (Temporal)   Ht 5\' 3"  (1.6 m)   Wt 145 lb (65.8 kg)   BMI 25.69 kg/m   Physical Examination:  General: Awake, alert, well nourished, No acute distress HEENT: No exophthalmos.  No goiter.  Sclera white Cardio: Pulse normal Pulmonary: Normal work of breathing on room air Psych: Mood stable, speech normal, affect appropriate, pleasant and interactive Depression screen [facility] 2/9 06/28/2019 12/27/2018 08/13/2018  Decreased Interest 2 0 0  Down, Depressed, Hopeless 2 0 0  PHQ - 2 Score 4 0 0  Altered sleeping 2 0 -  Tired, decreased  energy 2 0 -  Change in appetite 0 0 -  Feeling bad or failure about yourself  1 0 -  Trouble concentrating 1 0 -  Moving slowly or fidgety/restless 0 0 -  Suicidal thoughts 0 0 -  PHQ-9 Score 10 0 -  Difficult doing work/chores - - -   GAD 7 : Generalized Anxiety Score 06/28/2019  Nervous, Anxious, on Edge 2  Control/stop worrying 1  Worry too much - different things 2  Trouble relaxing 3  Restless 0  Easily annoyed or irritable 2  Afraid - awful might happen 1  Total GAD 7 Score 11   Assessment/ Plan: 23 y.o. female   1. Anxiety Likely would benefit from SSRI but given inability to remember to take her pill I have placed her on a as needed Atarax.  I did caution sedation.  Will make every effort to try and improve her anxiety such that she can be compliant with the mask at work as I do think that this would be beneficial to her long-term.  However, if she is unable to control anxiety symptoms with the PRN medication may need to consider writing a note excusing her from mass wearing.  She understands the risks of this.  She will follow-up with me in 2 weeks, sooner if needed - hydrOXYzine (ATARAX/VISTARIL) 25 MG tablet; Take 0.5-1 tablets (12.5-25 mg total) by mouth every 8 (eight) hours as needed for anxiety (panic).  Dispense: 30 tablet; Refill: 0   No orders of the defined types were placed in this encounter.  No orders of the defined types were placed in this encounter.    Janora Norlander, DO [place] [phone]

## 2019-06-28 NOTE — Patient Instructions (Signed)
I have prescribed you hydroxyzine to take 1/2-1 full tablet up to 3 times daily if needed for severe anxiety or panic disorder.  We discussed that this can cause sleepiness I would advise that the first time he uses it be at home and not at work.  If you have ongoing anxiety and panic despite this medication please contact me and I will go ahead and proceed with a note for work excusing you from use of mask.  We did discuss the risks of not using the mask including contracting COVID which can be life-threatening.   Living With Anxiety  After being diagnosed with an anxiety disorder, you may be relieved to know why you have felt or behaved a certain way. It is natural to also feel overwhelmed about the treatment ahead and what it will mean for your life. With care and support, you can manage this condition and recover from it. How to cope with anxiety Dealing with stress Stress is your body's reaction to life changes and events, both good and bad. Stress can last just a few hours or it can be ongoing. Stress can play a major role in anxiety, so it is important to learn both how to cope with stress and how to think about it differently. Talk with your health care provider or a counselor to learn more about stress reduction. He or she may suggest some stress reduction techniques, such as:  Music therapy. This can include creating or listening to music that you enjoy and that inspires you.  Mindfulness-based meditation. This involves being aware of your normal breaths, rather than trying to control your breathing. It can be done while sitting or walking.  Centering prayer. This is a kind of meditation that involves focusing on a word, phrase, or sacred image that is meaningful to you and that brings you peace.  Deep breathing. To do this, expand your stomach and inhale slowly through your nose. Hold your breath for 3-5 seconds. Then exhale slowly, allowing your stomach muscles to relax.  Self-talk.  This is a skill where you identify thought patterns that lead to anxiety reactions and correct those thoughts.  Muscle relaxation. This involves tensing muscles then relaxing them. Choose a stress reduction technique that fits your lifestyle and personality. Stress reduction techniques take time and practice. Set aside 5-15 minutes a day to do them. Therapists can offer training in these techniques. The training may be covered by some insurance plans. Other things you can do to manage stress include:  Keeping a stress diary. This can help you learn what triggers your stress and ways to control your response.  Thinking about how you respond to certain situations. You may not be able to control everything, but you can control your reaction.  Making time for activities that help you relax, and not feeling guilty about spending your time in this way. Therapy combined with coping and stress-reduction skills provides the best chance for successful treatment. Medicines Medicines can help ease symptoms. Medicines for anxiety include:  Anti-anxiety drugs.  Antidepressants.  Beta-blockers. Medicines may be used as the main treatment for anxiety disorder, along with therapy, or if other treatments are not working. Medicines should be prescribed by a health care provider. Relationships Relationships can play a big part in helping you recover. Try to spend more time connecting with trusted friends and family members. Consider going to couples counseling, taking family education classes, or going to family therapy. Therapy can help you and others better understand  the condition. How to recognize changes in your condition Everyone has a different response to treatment for anxiety. Recovery from anxiety happens when symptoms decrease and stop interfering with your daily activities at home or work. This may mean that you will start to:  Have better concentration and focus.  Sleep better.  Be less  irritable.  Have more energy.  Have improved memory. It is important to recognize when your condition is getting worse. Contact your health care provider if your symptoms interfere with home or work and you do not feel like your condition is improving. Where to find help and support: You can get help and support from these sources:  Self-help groups.  Online and [employer].  A trusted spiritual leader.  Couples counseling.  Family education classes.  Family therapy. Follow these instructions at home:  Eat a healthy diet that includes plenty of vegetables, fruits, whole grains, low-fat dairy products, and lean protein. Do not eat a lot of foods that are high in solid fats, added sugars, or salt.  Exercise. Most adults should do the following: ? Exercise for at least 150 minutes each week. The exercise should increase your heart rate and make you sweat (moderate-intensity exercise). ? Strengthening exercises at least twice a week.  Cut down on caffeine, tobacco, alcohol, and other potentially harmful substances.  Get the right amount and quality of sleep. Most adults need 7-9 hours of sleep each night.  Make choices that simplify your life.  Take over-the-counter and prescription medicines only as told by your health care provider.  Avoid caffeine, alcohol, and certain over-the-counter cold medicines. These may make you feel worse. Ask your pharmacist which medicines to avoid.  Keep all follow-up visits as told by your health care provider. This is important. Questions to ask your health care provider  Would I benefit from therapy?  How often should I follow up with a health care provider?  How long do I need to take medicine?  Are there any long-term side effects of my medicine?  Are there any alternatives to taking medicine? Contact a health care provider if:  You have a hard time staying focused or finishing daily tasks.  You spend many hours a day  feeling worried about everyday life.  You become exhausted by worry.  You start to have headaches, feel tense, or have nausea.  You urinate more than normal.  You have diarrhea. Get help right away if:  You have a racing heart and shortness of breath.  You have thoughts of hurting yourself or others. If you ever feel like you may hurt yourself or others, or have thoughts about taking your own life, get help right away. You can go to your nearest emergency department or call:  Your local emergency services (911 in the [place]  A suicide crisis helpline, such as the [facility] at 8648683670. This is open 24-hours a day. Summary  Taking steps to deal with stress can help calm you.  Medicines cannot cure anxiety disorders, but they can help ease symptoms.  Family, friends, and partners can play a big part in helping you recover from an anxiety disorder. This information is not intended to replace advice given to you by your health care provider. Make sure you discuss any questions you have with your health care provider. Document Released: 11/01/2016 Document Revised: 10/20/2017 Document Reviewed: 11/01/2016 Elsevier Patient Education  2020 [employer].

## 2019-07-24 ENCOUNTER — Ambulatory Visit (INDEPENDENT_AMBULATORY_CARE_PROVIDER_SITE_OTHER): Payer: BC Managed Care – PPO | Admitting: Family Medicine

## 2019-07-24 DIAGNOSIS — F419 Anxiety disorder, unspecified: Secondary | ICD-10-CM

## 2019-07-24 DIAGNOSIS — R61 Generalized hyperhidrosis: Secondary | ICD-10-CM

## 2019-07-24 NOTE — Progress Notes (Signed)
Telephone visit  Subjective: CC: f/u gAD PCP: Pamela [name], Pamela [name] FXT:[id number] Pieratt is a 23 y.o. female calls for telephone consult today. Patient provides verbal consent for consult held via phone.  Location of patient: work Location of provider: [facility] Others present for call: none  1.  Anxiety disorder Patient was last seen on 06/28/2019.  We discussed various options but she ultimately asked for PRN medication.  Atarax was prescribed but she never ultimately picked up the medication because she instead quit the job which was causing the anxiety.  She is now working as a Research scientist (physical sciences) and notes that she does have some stress with learning the job but overall is feeling much better than what she was.  She continues to have difficulty with sleep, citing that her anxiety will keep her up at nighttime sometimes.  2.  Night sweats She continues to have intermittent night sweats.  She often notes her head being soaked when she wakes up.  No unplanned weight loss.   ROS: Per HPI  Allergies  Allergen Reactions  . Penicillins Other (See Comments)    Don't respond to PCN.    Past Medical History:  Diagnosis Date  . Bad odor of urine 08/21/2015  . BV (bacterial vaginosis) 01/28/2015  . Dysmenorrhea 10/14/2014  . History of chlamydia 09/22/2015  . History of UTI 09/22/2015  . Menorrhagia with regular cycle 10/14/2014  . Nexplanon in place 08/21/2015  . Ovarian cyst 08/03/2017  . UTI (lower urinary tract infection) 08/21/2015  . Vaginal discharge 12/11/2014  . Yeast infection 12/11/2014    Current Outpatient Medications:  .  etonogestrel (NEXPLANON) 68 MG IMPL implant, 1 each by Subdermal route once., Disp: , Rfl:  .  hydrOXYzine (ATARAX/VISTARIL) 25 MG tablet, Take 0.5-1 tablets (12.5-25 mg total) by mouth every 8 (eight) hours as needed for anxiety (panic)., Disp: 30 tablet, Rfl: 0  Depression screen [facility] 2/9 07/24/2019 06/28/2019 12/27/2018 08/13/2018 10/24/2017  Decreased Interest 0 2 0 0 0   Down, Depressed, Hopeless 0 2 0 0 0  PHQ - 2 Score 0 4 0 0 0  Altered sleeping 1 2 0 - -  Tired, decreased energy 1 2 0 - -  Change in appetite 0 0 0 - -  Feeling bad or failure about yourself  0 1 0 - -  Trouble concentrating 0 1 0 - -  Moving slowly or fidgety/restless 0 0 0 - -  Suicidal thoughts 0 0 0 - -  PHQ-9 Score 2 10 0 - -  Difficult doing work/chores Not difficult at all - - - -   GAD 7 : Generalized Anxiety Score 07/24/2019 06/28/2019  Nervous, Anxious, on Edge 2 2  Control/stop worrying 0 1  Worry too much - different things 0 2  Trouble relaxing 0 3  Restless 0 0  Easily annoyed or irritable 0 2  Afraid - awful might happen 0 1  Total GAD 7 Score 2 11  Anxiety Difficulty Not difficult at all -   Assessment/ Plan: 23 y.o. female   1. Anxiety Under much better control now that her work environment has changed.  She continues to have some difficulty with sleep and therefore I recommend that she proceed with the hydroxyzine at bedtime if needed.  2. Night sweats Uncertain etiology.  They are intermittent.  We will look for metabolic cause.  We discussed the possible differential diagnoses today and she voiced good understanding.  Further plan pending laboratory work-up. - TSH; Future -  CBC; Future - CMP14+EGFR; Future   Start time: 3:06p End time: 3:14p  Total time spent on patient care (including telephone call/ virtual visit): 15 minutes  Pamela [name], Pamela [name] [phone]

## 2019-07-24 NOTE — Patient Instructions (Signed)
Hyperhidrosis Hyperhidrosis is a condition in which the body sweats a lot more than normal (excessively). Sweating is a necessary function for a human body. It is normal to sweat when you are hot, physically active, or anxious. However, hyperhidrosis is sweating to an excessive degree. Although the condition is not a serious one, it can make you feel embarrassed. There are two kinds of hyperhidrosis:  Primary hyperhidrosis. The sweating usually localizes in one part of your body, such as your underarms, or in a few areas, such as your feet, face, underarms, and hands. This is the more common kind of hyperhidrosis.  Secondary hyperhidrosis. This type usually affects your entire body. What are the causes? The cause of this condition depends on the kind of hyperhidrosis that you have.  Primary hyperhidrosis may be caused by sweat glands that are more active than normal.  Secondary hyperhidrosis may be caused by an underlying condition or by taking certain medicines, such as antidepressants or diabetes medicines. Possible conditions that may cause secondary hyperhidrosis include: ? Diabetes. ? Gout. ? Anxiety. ? Obesity. ? Menopause. ? Overactive thyroid (hyperthyroidism). ? Tumors. ? Frostbite. ? Certain types of cancers. ? Alcoholism. ? Injury to your nervous system. ? Stroke. ? Parkinson's disease. What increases the risk? You are more likely to develop primary hyperhidrosis if you have a family history of the condition. What are the signs or symptoms? Symptoms of this condition include:  Feeling like you are sweating constantly, even while you are not being active.  Having skin that peels or gets paler or softer in the areas where you sweat the most.  Being able to see sweat on your skin. Other symptoms depend on the kind of hyperhidrosis that you have.  Symptoms of primary hyperhidrosis may include: ? Sweating in the same location on both sides of your body. ? Sweating only  during the day and not while you are sleeping. ? Sweating in specific areas, such as your underarms, palms, feet, and face.  Symptoms of secondary hyperhidrosis may include: ? Sweating all over your body. ? Sweating even while you sleep. How is this diagnosed? This condition may be diagnosed by:  Medical history.  Physical exam. You may also have other tests, including:  Tests to measure the amount of sweat you produce and to show the areas where you sweat the most. These tests may involve: ? Using color-changing chemicals to show patterns of sweating on the skin. ? Weighing paper that has been applied to the skin. This will show the amount of sweat that your body produces. ? Measuring the amount of water that evaporates from the skin. ? Using infrared technology to show patterns of sweating on the skin.  Tests to check for other conditions that may be causing excess sweating. This may include blood, urine, or imaging tests. How is this treated? Treatment for this condition depends on the kind of hyperhidrosis that you have and the areas of your body that are affected. Your health care provider will also treat any underlying conditions. Treatment may include:  Medicines, such as: ? Antiperspirants. These are medicines that stop sweat. ? Injectable medicines. These may include small injections of botulinum toxin. ? Oral medicines. These are taken by mouth to treat underlying conditions and other symptoms.  A procedure to: ? Temporarily turn off the sweat glands in your hands and feet (iontophoresis). ? Remove your sweat glands. ? Cut or destroy the nerves so that they do not send a signal to the sweat  glands (sympathectomy). Follow these instructions at home: Lifestyle   Limit or avoid foods or beverages that may increase your risk of sweating, such as: ? Spicy food. ? Caffeine. ? Alcohol. ? Foods that contain monosodium glutamate (MSG).  If your feet sweat: ? Wear  sandals when possible. ? Do not wear cotton socks. Wear socks that remove or wick moisture from your feet. ? Wear leather shoes. ? Avoid wearing the same pair of shoes for two days in a row.  Try placing sweat pads under your clothes to prevent underarm sweat from showing.  Keep a journal of your sweat symptoms and when they occur. This may help you identify things that trigger your sweating. General instructions  Take over-the-counter and prescription medicines only as told by your health care provider.  Use antiperspirants as told by your health care provider.  Consider joining a hyperhidrosis support group.  Keep all follow-up visits as told by your health care provider. This is important. Contact a health care provider if:  You have new symptoms.  Your symptoms get worse. Summary  Hyperhidrosis is a condition in which the body sweats a lot more than normal (excessively).  With primary hyperhidrosis, the sweating usually localizes in one part of your body, such as your underarms, or in a few areas, such as your feet, face, underarms, and hands. It is caused by overactive sweat glands in the affected area.  With secondary hyperhidrosis, the sweating affects your entire body. This is caused by an underlying condition.  Treatment for this condition depends on the kind of hyperhidrosis that you have and the parts of your body that are affected. This information is not intended to replace advice given to you by your health care provider. Make sure you discuss any questions you have with your health care provider. Document Released: 01/06/2006 Document Revised: 11/10/2017 Document Reviewed: 11/10/2017 Elsevier Patient Education  2020 [employer].

## 2019-09-17 ENCOUNTER — Ambulatory Visit (INDEPENDENT_AMBULATORY_CARE_PROVIDER_SITE_OTHER): Payer: BC Managed Care – PPO | Admitting: Family Medicine

## 2019-09-17 DIAGNOSIS — U071 COVID-19: Secondary | ICD-10-CM

## 2019-09-17 NOTE — Progress Notes (Signed)
Telephone visit  Subjective: CC: f/u COVID19 PCP: Janora Norlander, DO [id number] Pamela [name] is a 23 y.o. female calls for telephone consult today. Patient provides verbal consent for consult held via phone.  Location of patient: Home Location of provider: [facility] Others present for call: None  1.  COVID-19 Patient was diagnosed with COVID-19 infection on 08/30/2019.  By the time that she was retested on 09/10/2019 she had already been asymptomatic for a couple of days but wanted to make sure that she had cleared the virus before returning to work.  That test actually came back positive again for COVID-19.  She has since stayed at home despite having no symptoms because she was worried about transmitting it to other people.  She continues to have no symptoms including fever, chills, myalgia, headache, rhinorrhea, cough, congestion, diarrhea or nausea.  She is ready to go back to work.   ROS: Per HPI  Allergies  Allergen Reactions  . Penicillins Other (See Comments)    Don't respond to PCN.    Past Medical History:  Diagnosis Date  . Bad odor of urine 08/21/2015  . BV (bacterial vaginosis) 01/28/2015  . Dysmenorrhea 10/14/2014  . History of chlamydia 09/22/2015  . History of UTI 09/22/2015  . Menorrhagia with regular cycle 10/14/2014  . Nexplanon in place 08/21/2015  . Ovarian cyst 08/03/2017  . UTI (lower urinary tract infection) 08/21/2015  . Vaginal discharge 12/11/2014  . Yeast infection 12/11/2014    Current Outpatient Medications:  .  etonogestrel (NEXPLANON) 68 MG IMPL implant, 1 each by Subdermal route once., Disp: , Rfl:  .  hydrOXYzine (ATARAX/VISTARIL) 25 MG tablet, Take 0.5-1 tablets (12.5-25 mg total) by mouth every 8 (eight) hours as needed for anxiety (panic)., Disp: 30 tablet, Rfl: 0  Assessment/ Plan: 23 y.o. female   1. COVID-19 virus infection She has been totally asymptomatic unmedicated for over a week now.  I think that she is medically fit to return to work.  We  discussed continuing to exercise social distancing, masking and guidelines outlined by the CDC to prevent reinfection.  A work note has been placed in her medical chart and she will have this sent to her employer.  She will follow-up with me as needed   Start time: 10:25am End time: 10:31am  Total time spent on patient care (including telephone call/ virtual visit): 9 minutes  [place], [place] [phone]

## 2020-02-14 ENCOUNTER — Encounter: Payer: Self-pay | Admitting: Family

## 2020-02-14 ENCOUNTER — Telehealth (INDEPENDENT_AMBULATORY_CARE_PROVIDER_SITE_OTHER): Payer: BC Managed Care – PPO | Admitting: Family

## 2020-02-14 DIAGNOSIS — H65112 Acute and subacute allergic otitis media (mucoid) (sanguinous) (serous), left ear: Secondary | ICD-10-CM

## 2020-02-14 MED ORDER — CEFDINIR 300 MG PO CAPS: 300.0000 mg | ORAL_CAPSULE | Freq: Two times a day (BID) | ORAL | 0 refills | Status: DC

## 2020-02-14 NOTE — Progress Notes (Signed)
   Virtual Visit via telephone Note Due to COVID-19 pandemic this visit was conducted virtually. This visit type was conducted due to national recommendations for restrictions regarding the COVID-19 Pandemic (e.g. social distancing, sheltering in place) in an effort to limit this patient's exposure and mitigate transmission in our community. All issues noted in this document were discussed and addressed.  A physical exam was not performed with this format.  I connected with Pamela [name] on 02/14/20 at 1:52 pm  by video and verified that I am speaking with the correct person using two identifiers. Pamela [name] is currently located at home  and no one is currently with her  during visit. The provider, Evelina Dun, FNP is located in their office at time of visit.  I discussed the limitations, risks, security and privacy concerns of performing an evaluation and management service by telephone and the availability of in person appointments. I also discussed with the patient that there may be a patient responsible charge related to this service. The patient expressed understanding and agreed to proceed.   History and Present Illness:  Otalgia  There is pain in the left ear. This is a new problem. The current episode started 1 to 4 weeks ago. The problem occurs constantly. The problem has been gradually worsening. There has been no fever. The pain is at a severity of 8/10. The pain is moderate. Associated symptoms include ear discharge and headaches. Pertinent negatives include no coughing, rhinorrhea or sore throat. She has tried NSAIDs for the symptoms. The treatment provided mild relief.      Review of Systems  HENT: Positive for ear discharge and ear pain. Negative for rhinorrhea and sore throat.   Respiratory: Negative for cough.   Neurological: Positive for headaches.  All other systems reviewed and are negative.    Observations/Objective: No SOB or distress noted   Assessment and  Plan: 1. Non-recurrent acute allergic otitis media of left ear Rest Tylenol as needed Keep clean and dry Call if symptoms worsen or do not improve  - cefdinir (OMNICEF) 300 MG capsule; Take 1 capsule (300 mg total) by mouth 2 (two) times daily. 1 po BID  Dispense: 20 capsule; Refill: 0     I discussed the assessment and treatment plan with the patient. The patient was provided an opportunity to ask questions and all were answered. The patient agreed with the plan and demonstrated an understanding of the instructions.   The patient was advised to call back or seek an in-person evaluation if the symptoms worsen or if the condition fails to improve as anticipated.  The above assessment and management plan was discussed with the patient. The patient verbalized understanding of and has agreed to the management plan. Patient is aware to call the clinic if symptoms persist or worsen. Patient is aware when to return to the clinic for a follow-up visit. Patient educated on when it is appropriate to go to the emergency department.   Time call ended:  2:03 pm   I provided 11 minutes of non-face-to-face time during this encounter.    Evelina Dun, FNP

## 2020-05-01 ENCOUNTER — Other Ambulatory Visit: Payer: Self-pay | Admitting: Women's Health

## 2020-05-01 ENCOUNTER — Telehealth: Payer: Self-pay | Admitting: Adult Health

## 2020-05-01 MED ORDER — FLUCONAZOLE 150 MG PO TABS: 150.0000 mg | ORAL_TABLET | Freq: Once | ORAL | 0 refills | Status: AC

## 2020-05-01 NOTE — Telephone Encounter (Signed)
Pt informed rx sent to pharmacy.

## 2020-05-01 NOTE — Telephone Encounter (Signed)
Patient called stating that she has a yeast infection, she know for sure that is what she has and would like for Anderson Malta to call her in something for it at her pharmacy [facility]. Please contact when medication has been sent.

## 2020-06-17 ENCOUNTER — Other Ambulatory Visit: Payer: Self-pay

## 2020-06-17 ENCOUNTER — Ambulatory Visit (INDEPENDENT_AMBULATORY_CARE_PROVIDER_SITE_OTHER): Payer: BC Managed Care – PPO | Admitting: Family

## 2020-06-17 ENCOUNTER — Encounter: Payer: Self-pay | Admitting: Family

## 2020-06-17 VITALS — BP 114/76 | HR 71 | Temp 98.0°F | Ht 63.0 in | Wt 166.0 lb

## 2020-06-17 DIAGNOSIS — Z8742 Personal history of other diseases of the female genital tract: Secondary | ICD-10-CM

## 2020-06-17 DIAGNOSIS — R140 Abdominal distension (gaseous): Secondary | ICD-10-CM

## 2020-06-17 DIAGNOSIS — R1030 Lower abdominal pain, unspecified: Secondary | ICD-10-CM | POA: Diagnosis not present

## 2020-06-17 LAB — URINALYSIS, COMPLETE
Bilirubin, UA: NEGATIVE
Glucose, UA: NEGATIVE
Ketones, UA: NEGATIVE
Leukocytes,UA: NEGATIVE
Nitrite, UA: NEGATIVE
Protein,UA: NEGATIVE
RBC, UA: NEGATIVE
Specific Gravity, UA: 1.025 (ref 1.005–1.030)
Urobilinogen, Ur: 0.2 mg/dL (ref 0.2–1.0)
pH, UA: 7 (ref 5.0–7.5)

## 2020-06-17 LAB — MICROSCOPIC EXAMINATION
RBC, Urine: NONE SEEN /hpf (ref 0–2)
WBC, UA: NONE SEEN /hpf (ref 0–5)

## 2020-06-17 NOTE — Patient Instructions (Signed)
Ovarian Cyst     An ovarian cyst is a fluid-filled sac that forms on an ovary. The ovaries are small organs that produce eggs in women. Various types of cysts can form on the ovaries. Some may cause symptoms and require treatment. Most ovarian cysts go away on their own, are not cancerous (are benign), and do not cause problems. Common types of ovarian cysts include:  Functional (follicle) cysts. ? Occur during the menstrual cycle, and usually go away with the next menstrual cycle if you do not get pregnant. ? Usually cause no symptoms.  Endometriomas. ? Are cysts that form from the tissue that lines the uterus (endometrium). ? Are sometimes called "chocolate cysts" because they become filled with blood that turns brown. ? Can cause pain in the lower abdomen during intercourse and during your period.  Cystadenoma cysts. ? Develop from cells on the outside surface of the ovary. ? Can get very large and cause lower abdomen pain and pain with intercourse. ? Can cause severe pain if they twist or break open (rupture).  Dermoid cysts. ? Are sometimes found in both ovaries. ? May contain different kinds of body tissue, such as skin, teeth, hair, or cartilage. ? Usually do not cause symptoms unless they get very big.  Theca lutein cysts. ? Occur when too much of a certain hormone (human chorionic gonadotropin) is produced and overstimulates the ovaries to produce an egg. ? Are most common after having procedures used to assist with the conception of a baby (in vitro fertilization). What are the causes? Ovarian cysts may be caused by:  Ovarian hyperstimulation syndrome. This is a condition that can develop from taking fertility medicines. It causes multiple large ovarian cysts to form.  Polycystic ovarian syndrome (PCOS). This is a common hormonal disorder that can cause ovarian cysts, as well as problems with your period or fertility. What increases the risk? The following factors may  make you more likely to develop ovarian cysts:  Being overweight or obese.  Taking fertility medicines.  Taking certain forms of hormonal birth control.  Smoking. What are the signs or symptoms? Many ovarian cysts do not cause symptoms. If symptoms are present, they may include:  Pelvic pain or pressure.  Pain in the lower abdomen.  Pain during sex.  Abdominal swelling.  Abnormal menstrual periods.  Increasing pain with menstrual periods. How is this diagnosed? These cysts are commonly found during a routine pelvic exam. You may have tests to find out more about the cyst, such as:  Ultrasound.  X-ray of the pelvis.  CT scan.  MRI.  Blood tests. How is this treated? Many ovarian cysts go away on their own without treatment. Your health care provider may want to check your cyst regularly for 2-3 months to see if it changes. If you are in menopause, it is especially important to have your cyst monitored closely because menopausal women have a higher rate of ovarian cancer. When treatment is needed, it may include:  Medicines to help relieve pain.  A procedure to drain the cyst (aspiration).  Surgery to remove the whole cyst.  Hormone treatment or birth control pills. These methods are sometimes used to help dissolve a cyst. Follow these instructions at home:  Take over-the-counter and prescription medicines only as told by your health care provider.  Do not drive or use heavy machinery while taking prescription pain medicine.  Get regular pelvic exams and Pap tests as often as told by your health care provider.    Return to your normal activities as told by your health care provider. Ask your health care provider what activities are safe for you.  Do not use any products that contain nicotine or tobacco, such as cigarettes and e-cigarettes. If you need help quitting, ask your health care provider.  Keep all follow-up visits as told by your health care provider.  This is important. Contact a health care provider if:  Your periods are late, irregular, or painful, or they stop.  You have pelvic pain that does not go away.  You have pressure on your bladder or trouble emptying your bladder completely.  You have pain during sex.  You have any of the following in your abdomen: ? A feeling of fullness. ? Pressure. ? Discomfort. ? Pain that does not go away. ? Swelling.  You feel generally ill.  You become constipated.  You lose your appetite.  You develop severe acne.  You start to have more body hair and facial hair.  You are gaining weight or losing weight without changing your exercise and eating habits.  You think you may be pregnant. Get help right away if:  You have abdominal pain that is severe or gets worse.  You cannot eat or drink without vomiting.  You suddenly develop a fever.  Your menstrual period is much heavier than usual. This information is not intended to replace advice given to you by your health care provider. Make sure you discuss any questions you have with your health care provider. Document Revised: 02/05/2018 Document Reviewed: 04/10/2016 Elsevier Patient Education  2020 [employer]  

## 2020-06-17 NOTE — Progress Notes (Signed)
Subjective:    Patient ID: Pamela [name], female    DOB: 1995-12-23, 23 y.o.   MRN: [id number]  Chief Complaint  Patient presents with  . Bloated  . Pelvic Pain   Pt presents to the office with lower abdominal pain that started three days ago. She reports the pain is worse in the morning and slightly improves after BM. She feels like her abdomen is bloated and tender. Denies any pain with sex or dysuria.  Pelvic Pain The patient's primary symptoms include pelvic pain. Associated symptoms include abdominal pain and frequency. Pertinent negatives include no constipation, diarrhea, dysuria, fever, hematuria, nausea or vomiting.  Abdominal Pain This is a new problem. The current episode started in the past 7 days (three days ago). The onset quality is sudden. The problem occurs intermittently. The problem has been waxing and waning. The pain is located in the suprapubic region, LLQ and RLQ. The pain is at a severity of 8/10. The quality of the pain is aching. Associated symptoms include frequency. Pertinent negatives include no belching, constipation, diarrhea, dysuria, fever, hematuria, nausea or vomiting. Nothing aggravates the pain. The pain is relieved by bowel movements. She has tried nothing for the symptoms. The treatment provided no relief.      Review of Systems  Constitutional: Negative for fever.  Gastrointestinal: Positive for abdominal pain. Negative for constipation, diarrhea, nausea and vomiting.  Genitourinary: Positive for frequency and pelvic pain. Negative for dysuria and hematuria.  All other systems reviewed and are negative.      Objective:   Physical Exam Vitals reviewed.  Constitutional:      General: She is not in acute distress.    Appearance: She is well-developed.  HENT:     Head: Normocephalic and atraumatic.     Right Ear: Tympanic membrane normal.     Left Ear: Tympanic membrane normal.  Eyes:     Pupils: Pupils are equal, round, and reactive to  light.  Neck:     Thyroid: No thyromegaly.  Cardiovascular:     Rate and Rhythm: Normal rate and regular rhythm.     Heart sounds: Normal heart sounds. No murmur heard.   Pulmonary:     Effort: Pulmonary effort is normal. No respiratory distress.     Breath sounds: Normal breath sounds. No wheezing.  Abdominal:     General: Bowel sounds are normal. There is no distension.     Palpations: Abdomen is soft.     Tenderness: There is abdominal tenderness (mild lower abdomen that is sligtly worse on right).  Musculoskeletal:        General: No tenderness. Normal range of motion.     Cervical back: Normal range of motion and neck supple.  Skin:    General: Skin is warm and dry.  Neurological:     Mental Status: She is alert and oriented to person, place, and time.     Cranial Nerves: No cranial nerve deficit.     Deep Tendon Reflexes: Reflexes are normal and symmetric.  Psychiatric:        Behavior: Behavior normal.        Thought Content: Thought content normal.        Judgment: Judgment normal.          BP 114/76   Pulse 71   Temp 98 F (36.7 C) (Temporal)   Ht 5\' 3"  (1.6 m)   Wt 166 lb (75.3 kg)   SpO2 99%   BMI 29.41 kg/m  Assessment & Plan:  Pamela [name] comes in today with chief complaint of Bloated and Pelvic Pain   Diagnosis and orders addressed:  1. Lower abdominal pain - Urinalysis, Complete - Urine Culture - US Pelvic Complete With Transvaginal; Future  2. Bloating - US Pelvic Complete With Transvaginal; Future  3. History of ovarian cyst - US Pelvic Complete With Transvaginal; Future  Urine negative More than likely Ovarian cyst I have ordered a [place], but discussed she could wait a few weeks to complete this to see if pain resolves.    Evelina Dun, FNP

## 2020-06-18 ENCOUNTER — Telehealth: Payer: Self-pay | Admitting: Family Medicine

## 2020-06-18 LAB — URINE CULTURE

## 2020-06-22 ENCOUNTER — Telehealth: Payer: Self-pay | Admitting: Family Medicine

## 2020-06-23 ENCOUNTER — Ambulatory Visit (HOSPITAL_COMMUNITY): Payer: BC Managed Care – PPO

## 2020-07-02 ENCOUNTER — Ambulatory Visit (INDEPENDENT_AMBULATORY_CARE_PROVIDER_SITE_OTHER): Payer: BC Managed Care – PPO | Admitting: Adult Health

## 2020-07-02 ENCOUNTER — Encounter: Payer: Self-pay | Admitting: Adult Health

## 2020-07-02 ENCOUNTER — Other Ambulatory Visit (HOSPITAL_COMMUNITY)
Admission: RE | Admit: 2020-07-02 | Discharge: 2020-07-02 | Disposition: A | Discharge status: Home / Self Care | Payer: BC Managed Care – PPO | Source: Ambulatory Visit | Attending: Adult Health | Admitting: Adult Health

## 2020-07-02 VITALS — BP 111/72 | HR 67 | Ht 63.0 in | Wt 165.0 lb

## 2020-07-02 DIAGNOSIS — N898 Other specified noninflammatory disorders of vagina: Secondary | ICD-10-CM | POA: Diagnosis not present

## 2020-07-02 DIAGNOSIS — Z975 Presence of (intrauterine) contraceptive device: Secondary | ICD-10-CM | POA: Diagnosis not present

## 2020-07-02 DIAGNOSIS — Z01419 Encounter for gynecological examination (general) (routine) without abnormal findings: Secondary | ICD-10-CM

## 2020-07-02 DIAGNOSIS — F32A Depression, unspecified: Secondary | ICD-10-CM

## 2020-07-02 DIAGNOSIS — F329 Major depressive disorder, single episode, unspecified: Secondary | ICD-10-CM | POA: Diagnosis not present

## 2020-07-02 NOTE — Progress Notes (Signed)
Patient ID: Pamela [name], female   DOB: May 25, 1996, 24 y.o.   MRN: [id number] History of Present Illness: Pamela [name] is a 24 year old white female,single, G0P0, in for well woman gyn exam, she had normal pap 08/13/18. She has vaginal odor.had had ?eyast and BV when going to there pool. PCP is Dr Lajuana Ripple.    Current Medications, Allergies, Past Medical History, Past Surgical History, Family History and Social History were reviewed in [employer] record.     Review of Systems:  Patient denies any headaches, hearing loss, fatigue, blurred vision, shortness of breath, chest pain, abdominal pain, problems with bowel movements, urination, or intercourse. No joint pain or mood swings. + vaginal odor    Physical Exam:BP 111/72 (BP Location: Right Arm, Patient Position: Sitting, Cuff Size: Normal)   Pulse 67   Ht 5\' 3"  (1.6 m)   Wt 165 lb (74.8 kg)   BMI 29.23 kg/m  General:  Well developed, well nourished, no acute distress Skin:  Warm and dry Neck:  Midline trachea, normal thyroid, good ROM, no lymphadenopathy Lungs; Clear to auscultation bilaterally Breast:  No dominant palpable mass, retraction, or nipple discharge Cardiovascular: Regular rate and rhythm Abdomen:  Soft, non tender, no hepatosplenomegaly Pelvic:  External genitalia is normal in appearance, no lesions.  The vagina is normal in appearance,has white discharge with sore odor. Urethra has no lesions or masses. The cervix is smooth.  Uterus is felt to be normal size, shape, and contour.  No adnexal masses or tenderness noted.Bladder is non tender, no masses felt. CV swab obtained.  Extremities/musculoskeletal:  No swelling or varicosities noted, no clubbing or cyanosis Psych:  No mood changes, alert and cooperative,seems happy AA is 0 Fall risk is low PHQ 9 score is 14, no So, declines meds and counseling  [facility] - 07/02/20 1429      Pregnancy Intention Screening   Does the patient want to become  pregnant in the next year? Unsure    Does the patient's partner want to become pregnant in the next year? Unsure    Would the patient like to discuss contraceptive options today? No      Contraception Wrap Up   Current Method Hormonal Implant    End Method Hormonal Implant    Contraception Counseling Provided No         Examination chaperoned by Celene Squibb LPN  Impression and Plan: 1. Encounter for well woman exam with routine gynecological exam Pap and physical in 1 year   2. Nexplanon in place  3. Vaginal odor CV swab sent  4. Vaginal discharge CV swab sent  5. Depression, unspecified depression type

## 2020-07-06 ENCOUNTER — Other Ambulatory Visit: Payer: Self-pay | Admitting: Adult Health

## 2020-07-06 LAB — CERVICOVAGINAL ANCILLARY ONLY
Bacterial Vaginitis (gardnerella): POSITIVE — AB
Candida Glabrata: NEGATIVE
Candida Vaginitis: NEGATIVE
Chlamydia: NEGATIVE
Comment: NEGATIVE
Comment: NEGATIVE
Comment: NEGATIVE
Comment: NEGATIVE
Comment: NEGATIVE
Comment: NORMAL
Neisseria Gonorrhea: NEGATIVE
Trichomonas: NEGATIVE

## 2020-07-06 MED ORDER — METRONIDAZOLE 500 MG PO TABS
500.0000 mg | ORAL_TABLET | Freq: Two times a day (BID) | ORAL | 0 refills | Status: DC
Start: 2020-07-06 — End: 2021-01-21

## 2020-07-06 NOTE — Progress Notes (Signed)
+  BV on CV swab will rx flagyl

## 2020-08-20 ENCOUNTER — Telehealth: Payer: Self-pay | Admitting: Family Medicine

## 2020-08-20 DIAGNOSIS — F419 Anxiety disorder, unspecified: Secondary | ICD-10-CM

## 2020-08-20 NOTE — Telephone Encounter (Signed)
REFERRAL REQUEST Telephone Note  Have you been seen at our office for this problem? Yes (Advise that they may need an appointment with their PCP before a referral can be done)  Reason for Referral: Now she feels like she needs a Therapist Referral discussed with patient: Yes Best contact number of patient for referral team:  [phone]  Has patient been seen by a specialist for this issue before: No  Patient provider preference for referral: none Patient location preference for referral: [facility]   Patient notified that referrals can take up to a week or longer to process. If they haven't heard anything within a week they should call back and speak with the referral department.   [facility] pt.  Please call pt.  She has decided to go now.

## 2020-12-16 ENCOUNTER — Telehealth: Payer: Self-pay

## 2020-12-16 MED ORDER — MEGESTROL ACETATE 40 MG PO TABS
ORAL_TABLET | ORAL | 1 refills | Status: DC
Start: 2020-12-16 — End: 2021-01-21

## 2020-12-16 NOTE — Telephone Encounter (Signed)
This period heavy, changing tampon every 1-2 hours, has clots, use pad, has nexplanon, due to have nexplanon removed in March, will megace to stop bleeding.

## 2020-12-16 NOTE — Telephone Encounter (Signed)
Patient called in c/o a heavier menstrual cycle than what is normal for her. She states that she has Nexplanon and has never had a cycle like this before. She feels like she is going to bleed out it is that bad. She is in bed because of the pain. Please call.

## 2020-12-16 NOTE — Telephone Encounter (Signed)
Pt's period started Friday, very heavy and bright red. Pt is bleeding through super tampon in less than 2 hours. Pt has Nexplanon. Got this one placed 01/2018. Could this be because Nexplanon is due to come out in March? Can you prescribe something to help with bleeding? Pt says she can't continue like this. Please advise. Thanks!! [place]

## 2021-01-21 ENCOUNTER — Encounter: Payer: Self-pay | Admitting: Adult Health

## 2021-01-21 ENCOUNTER — Ambulatory Visit (INDEPENDENT_AMBULATORY_CARE_PROVIDER_SITE_OTHER): Payer: BC Managed Care – PPO | Admitting: Adult Health

## 2021-01-21 ENCOUNTER — Other Ambulatory Visit: Payer: Self-pay

## 2021-01-21 VITALS — BP 120/77 | HR 70 | Ht 63.0 in | Wt 161.0 lb

## 2021-01-21 DIAGNOSIS — Z3202 Encounter for pregnancy test, result negative: Secondary | ICD-10-CM | POA: Insufficient documentation

## 2021-01-21 DIAGNOSIS — Z113 Encounter for screening for infections with a predominantly sexual mode of transmission: Secondary | ICD-10-CM | POA: Insufficient documentation

## 2021-01-21 DIAGNOSIS — Z30011 Encounter for initial prescription of contraceptive pills: Secondary | ICD-10-CM | POA: Insufficient documentation

## 2021-01-21 DIAGNOSIS — Z3046 Encounter for surveillance of implantable subdermal contraceptive: Secondary | ICD-10-CM | POA: Diagnosis not present

## 2021-01-21 LAB — POCT URINE PREGNANCY: Preg Test, Ur: NEGATIVE

## 2021-01-21 MED ORDER — NORETHIN ACE-ETH ESTRAD-FE 1-20 MG-MCG PO TABS
1.0000 | ORAL_TABLET | Freq: Every day | ORAL | 11 refills | Status: DC
Start: 2021-01-21 — End: 2024-05-30

## 2021-01-21 NOTE — Progress Notes (Signed)
  Subjective:     Patient ID: Pamela [name], female   DOB: 11/20/1996, 25 y.o.   MRN: [id number]  HPI Pamela [name] is a 25 year old white female, single, G0P0 for nexplanon removal and try OCs for now. PCP is Dr Lajuana Ripple.   Review of Systems For nexplanon removal Reviewed past medical,surgical, social and family history. Reviewed medications and allergies.     Objective:   Physical Exam BP 120/77 (BP Location: Right Arm, Patient Position: Sitting, Cuff Size: Normal)   Pulse 70   Ht 5\' 3"  (1.6 m)   Wt 161 lb (73 kg)   BMI 28.52 kg/m UPT negative. Consent signed and time out called.  Left arm cleansed with betadine, and injected with 1.5 cc 2% lidocaine and waited til numb.Under sterile technique a #11 blade was used to make small vertical incision, and a curved forceps was used to easily remove rod. Steri strips applied. Pressure dressing applied.   Fall risk is moderate  [facility] - 01/21/21 1554      Pregnancy Intention Screening   Does the patient want to become pregnant in the next year? No    Does the patient's partner want to become pregnant in the next year? No    Would the patient like to discuss contraceptive options today? Yes      Contraception Wrap Up   Current Method Hormonal Implant    End Method Oral Contraceptive;Female Condom    Contraception Counseling Provided Yes          Assessment:        1. Pregnancy examination or test, negative result   2. Screening examination for STD (sexually transmitted disease) Urine sent for GC/CHL  3. Encounter for Nexplanon removal Use condoms x 4 weeks, keep clean and dry x 24 hours, no heavy lifting, keep steri strips on x 72 hours, Keep pressure dressing on x 24 hours. Follow up prn problems.  4. Encounter for initial prescription of contraceptive pills Start OCs, use condoms Meds ordered this encounter  Medications  . norethindrone-ethinyl estradiol (LOESTRIN FE) 1-20 MG-MCG tablet    Sig: Take 1 tablet by mouth daily.     Dispense:  28 tablet    Refill:  11    Order Specific Question:   Supervising Provider    Answer:   Florian Buff [[id number]]      Plan:     Follow up in 3 months for ROS

## 2021-01-21 NOTE — Patient Instructions (Signed)
Use condoms x 4 weeks, keep clean and dry x 24 hours, no heavy lifting, keep steri strips on x 72 hours, Keep pressure dressing on x 24 hours. Follow up prn problems. Start OCs Follow up in 3 months

## 2021-01-24 LAB — GC/CHLAMYDIA PROBE AMP
Chlamydia trachomatis, NAA: NEGATIVE
Neisseria Gonorrhoeae by PCR: NEGATIVE

## 2021-04-22 ENCOUNTER — Ambulatory Visit: Payer: BC Managed Care – PPO | Admitting: Adult Health

## 2021-09-14 ENCOUNTER — Encounter: Payer: Self-pay | Admitting: Nurse Practitioner

## 2021-09-14 ENCOUNTER — Ambulatory Visit (INDEPENDENT_AMBULATORY_CARE_PROVIDER_SITE_OTHER): Payer: BC Managed Care – PPO | Admitting: Nurse Practitioner

## 2021-09-14 ENCOUNTER — Telehealth: Payer: Self-pay | Admitting: Family Medicine

## 2021-09-14 ENCOUNTER — Other Ambulatory Visit: Payer: Self-pay

## 2021-09-14 DIAGNOSIS — J029 Acute pharyngitis, unspecified: Secondary | ICD-10-CM | POA: Insufficient documentation

## 2021-09-14 LAB — CULTURE, GROUP A STREP

## 2021-09-14 LAB — RAPID STREP SCREEN (MED CTR MEBANE ONLY): Strep Gp A Ag, IA W/Reflex: NEGATIVE

## 2021-09-14 MED ORDER — AZITHROMYCIN 250 MG PO TABS: ORAL_TABLET | ORAL | 0 refills | Status: AC

## 2021-09-14 NOTE — Progress Notes (Signed)
   Virtual Visit  Note Due to COVID-19 pandemic this visit was conducted virtually. This visit type was conducted due to national recommendations for restrictions regarding the COVID-19 Pandemic (e.g. social distancing, sheltering in place) in an effort to limit this patient's exposure and mitigate transmission in our community. All issues noted in this document were discussed and addressed.  Pamela [name] physical exam was not performed with this format.  I connected with Pamela Pamela [name] on 09/14/21 at 9:32 AM by telephone and verified that I am speaking with the correct person using two identifiers. Pamela Pamela [name] is currently located at home during visit. The provider, Ivy Lynn, NP is located in their office at time of visit.  I discussed the limitations, risks, security and privacy concerns of performing an evaluation and management service by telephone and the availability of in person appointments. I also discussed with the patient that there may be Pamela [name] patient responsible charge related to this service. The patient expressed understanding and agreed to proceed.   History and Present Illness:  Sore Throat  This is Pamela [name] new problem. Episode onset: in the past 24 hr. The problem has been unchanged. The pain is worse on the left Pamela [name]. There has been no fever. The pain is at Pamela [name] severity of 8/10. Associated symptoms include coughing, ear pain, headaches and swollen glands. Pertinent negatives include no congestion. She has had no exposure to strep. She has tried nothing for the symptoms.     Review of Systems  Constitutional:  Negative for chills and fever.  HENT:  Positive for ear pain. Negative for congestion.   Respiratory:  Positive for cough.   Skin:  Negative for rash.  Neurological:  Positive for headaches.  All other systems reviewed and are negative.   Observations/Objective: Televisit patient not in distress.  Assessment and Plan: Take meds as prescribed - Use Pamela [name] cool mist humidifier   -Use saline nose sprays frequently -Force fluids -For fever or aches or pains- take Tylenol or ibuprofen. -Symptomatically treat patient for pharyngitis with Augmentin 875-125 mg tablet by mouth. -PCR COVID testing completed results pending. -Rapid strep test completed results pending. Follow up with worsening unresolved symptoms   Follow Up Instructions: Follow-up with worsening symptoms    I discussed the assessment and treatment plan with the patient. The patient was provided an opportunity to ask questions and all were answered. The patient agreed with the plan and demonstratedshould an understanding of the instructions.   The patient was advised to call back or seek an in-person evaluation if the symptoms worsen or if the condition fails to improve as anticipated.  The above assessment and management plan was discussed with the patient. The patient verbalized understanding of and has agreed to the management plan. Patient is aware to call the clinic if symptoms persist or worsen. Patient is aware when to return to the clinic for Pamela [name] follow-up visit. Patient educated on when it is appropriate to go to the emergency department.   Time call ended: 9:42 AM  I provided 10 minutes of  non face-to-face time during this encounter.    Ivy Lynn, NP Hello this is only Pamela Pamela [name] from [place] speak with Pamela Pamela [name] can you verify your birthdate for me ready Pamela Pamela [name] and Pamela Pamela [name]

## 2021-09-14 NOTE — Telephone Encounter (Signed)
Pt calling to check on strep results so that she can let her work know about coming back to work. No notes added to results yet. Please review and call back.

## 2021-09-14 NOTE — Assessment & Plan Note (Signed)
PatientTake meds as prescribed - Use a cool mist humidifier  -Use saline nose sprays frequently -Force fluids -For fever or aches or pains- take Tylenol or ibuprofen. -Symptomatically treat patient for pharyngitis with Augmentin 875-125 mg tablet by mouth. -PCR COVID testing completed results pending. -Rapid strep test completed results pending. Follow up with worsening unresolved symptoms   Rx sent to pharmacy.  Provided to patient, patient verbalized understanding.

## 2021-09-14 NOTE — Telephone Encounter (Signed)
Na

## 2021-09-14 NOTE — Addendum Note (Signed)
Addended by: Zannie Cove on: 09/14/2021 10:57 AM   Modules accepted: Orders

## 2021-09-14 NOTE — Telephone Encounter (Signed)
Patient aware of step results- Advised patient that she needed to wait until her COVID test came back to go back to work.

## 2021-09-14 NOTE — Patient Instructions (Signed)
Pharyngitis ?Pharyngitis is a sore throat (pharynx). This is when there is redness, pain, and swelling in your throat. Most of the time, this condition gets better on its own. In some cases, you may need medicine. ?What are the causes? ?An infection from a virus. ?An infection from bacteria. ?Allergies. ?What increases the risk? ?Being 5-24 years old. ?Being in crowded environments. These include: ?Daycares. ?Schools. ?Dormitories. ?Living in a place with cold temperatures outside. ?Having a weakened disease-fighting (immune) system. ?What are the signs or symptoms? ?Symptoms may vary depending on the cause. Common symptoms include: ?Sore throat. ?Tiredness (fatigue). ?Low-grade fever. ?Stuffy nose. ?Cough. ?Headache. ?Other symptoms may include: ?Glands in the neck (lymph nodes) that are swollen. ?Skin rashes. ?Film on the throat or tonsils. This can be caused by an infection from bacteria. ?Vomiting. ?Red, itchy eyes. ?Loss of appetite. ?Joint pain and muscle aches. ?Tonsils that are temporarily bigger than usual (enlarged). ?How is this treated? ?Many times, treatment is not needed. This condition usually gets better in 3-4 days without treatment. ?If the infection is caused by a bacteria, you may be need to take antibiotics. ?Follow these instructions at home: ?Medicines ?Take over-the-counter and prescription medicines only as told by your doctor. ?If you were prescribed an antibiotic medicine, take it as told by your doctor. Do not stop taking the antibiotic even if you start to feel better. ?Use throat lozenges or sprays to soothe your throat as told by your doctor. ?Children can get pharyngitis. Do not give your child aspirin. ?Managing pain ?To help with pain, try: ?Sipping warm liquids, such as: ?Broth. ?Herbal tea. ?Warm water. ?Eating or drinking cold or frozen liquids, such as frozen ice pops. ?Rinsing your mouth (gargle) with a salt water mixture 3-4 times a day or as needed. ?To make salt water,  dissolve ?-1 tsp (3-6 g) of salt in 1 cup (237 mL) of warm water. ?Do not swallow this mixture. ?Sucking on hard candy or throat lozenges. ?Putting a cool-mist humidifier in your bedroom at night to moisten the air. ?Sitting in the bathroom with the door closed for 5-10 minutes while you run hot water in the shower. ? ?General instructions ? ?Do not smoke or use any products that contain nicotine or tobacco. If you need help quitting, ask your doctor. ?Rest as told by your doctor. ?Drink enough fluid to keep your pee (urine) pale yellow. ?How is this prevented? ?Wash your hands often for at least 20 seconds with soap and water. If soap and water are not available, use hand sanitizer. ?Do not touch your eyes, nose, or mouth with unwashed hands. Wash hands after touching these areas. ?Do not share cups or eating utensils. ?Avoid close contact with people who are sick. ?Contact a doctor if: ?You have large, tender lumps in your neck. ?You have a rash. ?You cough up green, yellow-brown, or bloody spit. ?Get help right away if: ?You have a stiff neck. ?You drool or cannot swallow liquids. ?You cannot drink or take medicines without vomiting. ?You have very bad pain that does not go away with medicine. ?You have problems breathing, and it is not from a stuffy nose. ?You have new pain and swelling in your knees, ankles, wrists, or elbows. ?These symptoms may be an emergency. Get help right away. Call your local emergency services (911 in the [place] ?Do not wait to see if the symptoms will go away. ?Do not drive yourself to the hospital. ?Summary ?Pharyngitis is a sore throat (pharynx). This is   when there is redness, pain, and swelling in your throat. ?Most of the time, pharyngitis gets better on its own. Sometimes, you may need medicine. ?If you were prescribed an antibiotic medicine, take it as told by your doctor. Do not stop taking the antibiotic even if you start to feel better. ?This information is not intended to  replace advice given to you by your health care provider. Make sure you discuss any questions you have with your health care provider. ?Document Revised: 02/03/2021 Document Reviewed: 02/03/2021 ?Elsevier Patient Education ? 2022 Elsevier Inc. ? ?

## 2021-09-15 ENCOUNTER — Encounter: Payer: Self-pay | Admitting: Nurse Practitioner

## 2021-09-15 LAB — SARS-COV-2, NAA 2 DAY TAT

## 2021-09-15 LAB — NOVEL CORONAVIRUS, NAA: SARS-CoV-2, NAA: NOT DETECTED

## 2021-09-16 ENCOUNTER — Encounter: Payer: Self-pay | Admitting: *Deleted

## 2021-10-29 ENCOUNTER — Encounter: Payer: Self-pay | Admitting: Nurse Practitioner

## 2021-10-29 ENCOUNTER — Ambulatory Visit (INDEPENDENT_AMBULATORY_CARE_PROVIDER_SITE_OTHER): Payer: BC Managed Care – PPO | Admitting: Nurse Practitioner

## 2021-10-29 ENCOUNTER — Ambulatory Visit (INDEPENDENT_AMBULATORY_CARE_PROVIDER_SITE_OTHER): Payer: BC Managed Care – PPO

## 2021-10-29 VITALS — BP 102/69 | HR 72 | Temp 97.7°F | Resp 20 | Ht 63.0 in | Wt 158.0 lb

## 2021-10-29 DIAGNOSIS — R1084 Generalized abdominal pain: Secondary | ICD-10-CM | POA: Diagnosis not present

## 2021-10-29 DIAGNOSIS — K5909 Other constipation: Secondary | ICD-10-CM

## 2021-10-29 LAB — URINALYSIS, COMPLETE
Bilirubin, UA: NEGATIVE
Glucose, UA: NEGATIVE
Ketones, UA: NEGATIVE
Leukocytes,UA: NEGATIVE
Nitrite, UA: NEGATIVE
Protein,UA: NEGATIVE
RBC, UA: NEGATIVE
Specific Gravity, UA: 1.025 (ref 1.005–1.030)
Urobilinogen, Ur: 0.2 mg/dL (ref 0.2–1.0)
pH, UA: 5.5 (ref 5.0–7.5)

## 2021-10-29 LAB — MICROSCOPIC EXAMINATION
RBC, Urine: NONE SEEN /hpf (ref 0–2)
WBC, UA: NONE SEEN /hpf (ref 0–5)

## 2021-10-29 NOTE — Patient Instructions (Signed)

## 2021-10-29 NOTE — Progress Notes (Signed)
   Subjective:    Patient ID: Pamela [name], female    DOB: [birth year], 25 y.o.   MRN: [id number]  Chief Complaint: Abdominal pain for 2 weeks   HPI Patient has abdominal pain for 2 weeks. Was intermittent mainly in mornings, last 2 days has been constant. She has had similar pain in the past and was told she had ovarian cyst. LMP was 2 weeks ago and ws normal. Pain is suprapubic. Rates pain 7/10 in mornings then pain eases after voiding in morning and becomes a dull ache.    Review of Systems  Constitutional:  Negative for diaphoresis.  Eyes:  Negative for pain.  Cardiovascular:  Negative for chest pain, palpitations and leg swelling.  Gastrointestinal:  Negative for abdominal pain.  Endocrine: Negative for polydipsia.  Genitourinary:  Positive for pelvic pain. Negative for dysuria, flank pain, frequency, urgency, vaginal bleeding, vaginal discharge and vaginal pain.  Skin:  Negative for rash.  Neurological:  Negative for dizziness, weakness and headaches.  Hematological:  Does not bruise/bleed easily.  All other systems reviewed and are negative.     Objective:   Physical Exam Constitutional:      Appearance: Normal appearance.  Cardiovascular:     Rate and Rhythm: Normal rate and regular rhythm.     Heart sounds: Normal heart sounds.  Pulmonary:     Breath sounds: Normal breath sounds.  Abdominal:     General: Abdomen is flat. Bowel sounds are normal.     Palpations: Abdomen is soft.     Tenderness: There is abdominal tenderness (mild supra pubic tenderness).  Skin:    General: Skin is warm.  Neurological:     General: No focal deficit present.     Mental Status: She is alert and oriented to person, place, and time.   BP 102/69   Pulse 72   Temp 97.7 F (36.5 C) (Temporal)   Resp 20   Ht 5\' 3"  (1.6 m)   Wt 158 lb (71.7 kg)   SpO2 98%   BMI 27.99 kg/m   KUB- large stool burden -Preliminary reading by Ronnald Collum, FNP  [facility]        Assessment & Plan:    Pamela [name] in today with chief complaint of Abdominal pain for 2 weeks   1. Generalized abdominal pain - DG Abd 1 View - Urinalysis, Complete  2. Other constipation Force fluids Milk of magnesia and prune juice Miralax tomorrow Increase fiber in diet RTO prn    The above assessment and management plan was discussed with the patient. The patient verbalized understanding of and has agreed to the management plan. Patient is aware to call the clinic if symptoms persist or worsen. Patient is aware when to return to the clinic for a follow-up visit. Patient educated on when it is appropriate to go to the emergency department.   Mary-Margaret Hassell Done, FNP

## 2022-04-25 ENCOUNTER — Encounter: Payer: Self-pay | Admitting: Nurse Practitioner

## 2022-04-25 ENCOUNTER — Ambulatory Visit (INDEPENDENT_AMBULATORY_CARE_PROVIDER_SITE_OTHER): Payer: BC Managed Care – PPO | Admitting: Nurse Practitioner

## 2022-04-25 DIAGNOSIS — J069 Acute upper respiratory infection, unspecified: Secondary | ICD-10-CM

## 2022-04-25 MED ORDER — AZITHROMYCIN 250 MG PO TABS
ORAL_TABLET | ORAL | 0 refills | Status: DC
Start: 2022-04-25 — End: 2022-06-24

## 2022-04-25 MED ORDER — PROMETHAZINE-DM 6.25-15 MG/5ML PO SYRP: 5.0000 mL | ORAL_SOLUTION | Freq: Four times a day (QID) | ORAL | 0 refills | Status: DC | PRN

## 2022-04-25 NOTE — Patient Instructions (Signed)
1. Take meds as prescribed 2. Use a cool mist humidifier especially during the winter months and when heat has been humid. 3. Use saline nose sprays frequently 4. Saline irrigations of the nose can be very helpful if done frequently.  * 4X daily for 1 week*  * Use of a nettie pot can be helpful with this. Follow directions with this* 5. Drink plenty of fluids 6. Keep thermostat turn down low 7.For any cough or congestion- promethazine dm 8. For fever or aces or pains- take tylenol or ibuprofen appropriate for age and weight.  * for fevers greater than 101 orally you may alternate ibuprofen and tylenol every  3 hours.    

## 2022-04-25 NOTE — Progress Notes (Signed)
Virtual Visit  Note Due to COVID-19 pandemic this visit was conducted virtually. This visit type was conducted due to national recommendations for restrictions regarding the COVID-19 Pandemic (e.g. social distancing, sheltering in place) in an effort to limit this patient's exposure and mitigate transmission in our community. All issues noted in this document were discussed and addressed.  A physical exam was not performed with this format.  I connected with Pamela [name] on 04/25/22 at 2:05 by telephone and verified that I am speaking with the correct person using two identifiers. Pamela [name] is currently located at home and no one is currently with her during visit. The provider, Mary-Margaret Hassell Done, FNP is located in their office at time of visit.  I discussed the limitations, risks, security and privacy concerns of performing an evaluation and management service by telephone and the availability of in person appointments. I also discussed with the patient that there may be a patient responsible charge related to this service. The patient expressed understanding and agreed to proceed.   History and Present Illness:  URI  This is a new problem. Episode onset: 1 week ago. The problem has been waxing and waning. There has been no fever. Associated symptoms include congestion, coughing (productive now), rhinorrhea, sinus pain and a sore throat. Pertinent negatives include no headaches. Treatments tried: robitussin last night. The treatment provided mild relief.     Review of Systems  Constitutional:  Positive for chills and malaise/fatigue. Negative for fever.  HENT:  Positive for congestion, rhinorrhea, sinus pain and sore throat.   Respiratory:  Positive for cough (productive now).   Neurological:  Negative for headaches.    Observations/Objective: Alert and oriented- answers all questions appropriately No distress   Assessment and Plan: Pamela [name] comes in today with chief  complaint of No chief complaint on file.   Diagnosis and orders addressed:  1. URI with cough and congestion 1. Take meds as prescribed 2. Use a cool mist humidifier especially during the winter months and when heat has been humid. 3. Use saline nose sprays frequently 4. Saline irrigations of the nose can be very helpful if done frequently.  * 4X daily for 1 week*  * Use of a nettie pot can be helpful with this. Follow directions with this* 5. Drink plenty of fluids 6. Keep thermostat turn down low 7.For any cough or congestion- promethazine DM 8. For fever or aces or pains- take tylenol or ibuprofen appropriate for age and weight.  * for fevers greater than 101 orally you may alternate ibuprofen and tylenol every  3 hours.    - azithromycin (ZITHROMAX Z-PAK) 250 MG tablet; As directed  Dispense: 6 tablet; Refill: 0 - promethazine-dextromethorphan (PROMETHAZINE-DM) 6.25-15 MG/5ML syrup; Take 5 mLs by mouth 4 (four) times daily as needed for cough.  Dispense: 118 mL; Refill: 0    Follow up plan: prn   I discussed the assessment and treatment plan with the patient. The patient was provided an opportunity to ask questions and all were answered. The patient agreed with the plan and demonstrated an understanding of the instructions.   The patient was advised to call back or seek an in-person evaluation if the symptoms worsen or if the condition fails to improve as anticipated.  The above assessment and management plan was discussed with the patient. The patient verbalized understanding of and has agreed to the management plan. Patient is aware to call the clinic if symptoms persist or worsen. Patient is aware when to  return to the clinic for a follow-up visit. Patient educated on when it is appropriate to go to the emergency department.   Time call ended:  2:17  I provided 12 minutes of  non face-to-face time during this encounter.    Mary-Margaret Hassell Done, FNP

## 2022-06-24 ENCOUNTER — Ambulatory Visit (INDEPENDENT_AMBULATORY_CARE_PROVIDER_SITE_OTHER): Payer: BC Managed Care – PPO

## 2022-06-24 ENCOUNTER — Encounter: Payer: Self-pay | Admitting: Nurse Practitioner

## 2022-06-24 ENCOUNTER — Ambulatory Visit: Payer: BC Managed Care – PPO | Admitting: Nurse Practitioner

## 2022-06-24 VITALS — BP 106/74 | HR 82 | Temp 97.8°F | Resp 20 | Ht 63.0 in | Wt 167.0 lb

## 2022-06-24 DIAGNOSIS — M25562 Pain in left knee: Secondary | ICD-10-CM

## 2022-06-24 MED ORDER — PREDNISONE 10 MG (21) PO TBPK: ORAL_TABLET | ORAL | 0 refills | Status: DC

## 2022-06-24 NOTE — Progress Notes (Signed)
Subjective:    Patient ID: Pamela [name], female    DOB: [birth year], 26 y.o.   MRN: [id number]   Chief Complaint: Knee Pain (Left knee/)   Knee Pain  There was no injury mechanism. The pain is present in the left knee. The quality of the pain is described as stabbing and aching (strated this morning when she got out of bed). The pain is at a severity of 8/10. The pain is moderate. The pain has been Intermittent since onset. Associated symptoms include an inability to bear weight and a loss of motion. The symptoms are aggravated by movement. She has tried nothing for the symptoms.       Review of Systems  Constitutional:  Negative for diaphoresis.  Eyes:  Negative for pain.  Respiratory:  Negative for shortness of breath.   Cardiovascular:  Negative for chest pain, palpitations and leg swelling.  Gastrointestinal:  Negative for abdominal pain.  Endocrine: Negative for polydipsia.  Skin:  Negative for rash.  Neurological:  Negative for dizziness, weakness and headaches.  Hematological:  Does not bruise/bleed easily.  All other systems reviewed and are negative.      Objective:   Physical Exam Nursing note reviewed.  Constitutional:      General: She is not in acute distress.    Appearance: Normal appearance. She is well-developed.  Neck:     Vascular: No carotid bruit or JVD.  Cardiovascular:     Rate and Rhythm: Normal rate and regular rhythm.     Heart sounds: Normal heart sounds.  Pulmonary:     Effort: Pulmonary effort is normal. No respiratory distress.     Breath sounds: Normal breath sounds. No wheezing or rales.  Chest:     Chest wall: No tenderness.  Abdominal:     General: There is no distension or abdominal bruit.     Palpations: There is no hepatomegaly, splenomegaly, mass or pulsatile mass.     Tenderness: There is no abdominal tenderness.  Musculoskeletal:        General: Normal range of motion.     Comments: FROM of left knee with pain on full  flexion and extension All ligaments intact Mild effusion No patella tenderness  Lymphadenopathy:     Cervical: No cervical adenopathy.  Skin:    General: Skin is warm and dry.  Neurological:     Mental Status: She is alert and oriented to person, place, and time.     Deep Tendon Reflexes: Reflexes are normal and symmetric.  Psychiatric:        Behavior: Behavior normal.     BP 106/74   Pulse 82   Temp 97.8 F (36.6 C) (Temporal)   Resp 20   Ht '5\' 3"'$  (1.6 m)   Wt 167 lb (75.8 kg)   SpO2 96%   BMI 29.58 kg/m         Assessment & Plan:   Pamela [name] in today with chief complaint of Knee Pain (Left knee/)   1. Acute pain of left knee Rest Ice bid Compression wrap Elevate when sitting RTO prn - DG Knee 1-2 Views Left - predniSONE (STERAPRED UNI-PAK 21 TAB) 10 MG (21) TBPK tablet; As directed x 6 days  Dispense: 21 tablet; Refill: 0    The above assessment and management plan was discussed with the patient. The patient verbalized understanding of and has agreed to the management plan. Patient is aware to call the clinic if symptoms persist or worsen. Patient is aware  when to return to the clinic for a follow-up visit. Patient educated on when it is appropriate to go to the emergency department.   Mary-Margaret Hassell Done, FNP

## 2022-06-24 NOTE — Patient Instructions (Signed)
RICE Therapy for Routine Care of Injuries Many injuries can be cared for with rest, ice, compression, and elevation (RICE therapy). This includes: Resting the injured body part. Putting ice on the injury. Putting pressure (compression) on the injury. Raising the injured part (elevation). Using RICE therapy can help to lessen pain and swelling. Supplies needed: Ice. Plastic bag. Towel. Elastic bandage. Pillow or pillows to raise your injured body part. How to care for your injury with RICE therapy Rest Try to rest the injured part of your body. You can go back to your normal activities when your doctor says it is okay to do them and when you can do them without pain. If you rest the injury too much, it may not heal as well. Some injuries heal better with early movement instead of resting for too long. Ask your doctor if you should do exercises to help your injury get better. Ice  If told, put ice on the injured area. To do this: Put ice in a plastic bag. Place a towel between your skin and the bag. Leave the ice on for 20 minutes, 2-3 times a day. Take off the ice if your skin turns bright red. This is very important. If you cannot feel pain, heat, or cold, you have a greater risk of damage to the area. Do not put ice on your bare skin. Use ice for as many days as your doctor tells you to use it. Compression Put pressure on the injured area. This can be done with an elastic bandage. If this type of bandage has been put on your injury: Follow instructions on the package the bandage came in about how to use it. Do not wrap the bandage too tightly. Wrap the bandage more loosely if part of your body beyond the bandage is blue, swollen, cold, painful, or loses feeling. Take off the bandage and put it on again every 3-4 hours or as told by your doctor. See your doctor if the bandage seems to make your problems worse.  Elevation Raise the injured area above the level of your heart while you  are sitting or lying down. Follow these instructions at home: If your symptoms get worse or last a long time, make a follow-up appointment with your doctor. You may need to have imaging tests, such as X-rays or an MRI. If you have imaging tests, ask how to get your results when they are ready. Return to your normal activities when your doctor says that it is safe. Keep all follow-up visits. Contact a doctor if: You keep having pain and swelling. Your symptoms get worse. Get help right away if: You have sudden, very bad pain at your injury or lower than your injury. You have redness or more swelling around your injury. You have tingling or numbness at your injury or lower than your injury, and it does not go away when you take off the bandage. Summary Many injuries can be cared for using rest, ice, compression, and elevation (RICE therapy). You can go back to your normal activities when your doctor says it is okay and when you can do them without pain. Put ice on the injured area as told by your doctor. Get help if your symptoms get worse or if you keep having pain and swelling. This information is not intended to replace advice given to you by your health care provider. Make sure you discuss any questions you have with your health care provider. Document Revised: 08/27/2020 Document Reviewed: 08/27/2020   Elsevier Patient Education  2023 [employer]vier Inc[employer]  

## 2022-08-23 ENCOUNTER — Other Ambulatory Visit: Payer: Self-pay

## 2022-08-23 ENCOUNTER — Other Ambulatory Visit (INDEPENDENT_AMBULATORY_CARE_PROVIDER_SITE_OTHER): Payer: BC Managed Care – PPO

## 2022-08-23 DIAGNOSIS — R8290 Unspecified abnormal findings in urine: Secondary | ICD-10-CM | POA: Diagnosis not present

## 2022-08-23 DIAGNOSIS — R350 Frequency of micturition: Secondary | ICD-10-CM

## 2022-08-23 DIAGNOSIS — R3915 Urgency of urination: Secondary | ICD-10-CM | POA: Diagnosis not present

## 2022-08-23 LAB — POCT URINALYSIS DIPSTICK OB
Blood, UA: NEGATIVE
Glucose, UA: NEGATIVE
Ketones, UA: NEGATIVE
Nitrite, UA: NEGATIVE
POC,PROTEIN,UA: NEGATIVE

## 2022-08-23 NOTE — Progress Notes (Signed)
   NURSE VISIT- UTI SYMPTOMS   SUBJECTIVE:  Pamela [name] is a 26 y.o. G0P0000 female here for UTI symptoms. She is a GYN patient. She reports urinary frequency and urinary urgency and urine odor.  OBJECTIVE:  There were no vitals taken for this visit.  Appears well, in no apparent distress  Results for orders placed or performed in visit on 08/23/22 (from the past 24 hour(s))  POC Urinalysis Dipstick OB   Collection Time: 08/23/22  4:15 PM  Result Value Ref Range   Color, UA     Clarity, UA     Glucose, UA Negative Negative   Bilirubin, UA     Ketones, UA neg    Spec Grav, UA     Blood, UA neg    pH, UA     POC,PROTEIN,UA Negative Negative, Trace, Small (1+), Moderate (2+), Large (3+), 4+   Urobilinogen, UA     Nitrite, UA neg    Leukocytes, UA Trace (A) Negative   Appearance     Odor      ASSESSMENT: GYN patient with UTI symptoms and negative nitrites  PLAN: Note routed to Derrek Monaco, AGNP   Rx sent by provider today: No Urine culture sent GC/CHL added to urine Call or return to clinic prn if these symptoms worsen or fail to improve as anticipated. Follow-up: as needed   Alice Rieger  08/23/2022 4:18 PM

## 2022-08-24 LAB — URINALYSIS, ROUTINE W REFLEX MICROSCOPIC
Bilirubin, UA: NEGATIVE
Glucose, UA: NEGATIVE
Ketones, UA: NEGATIVE
Leukocytes,UA: NEGATIVE
Nitrite, UA: NEGATIVE
Protein,UA: NEGATIVE
RBC, UA: NEGATIVE
Specific Gravity, UA: 1.022 (ref 1.005–1.030)
Urobilinogen, Ur: 1 mg/dL (ref 0.2–1.0)
pH, UA: 7.5 (ref 5.0–7.5)

## 2022-08-25 LAB — URINE CULTURE: Organism ID, Bacteria: NO GROWTH

## 2022-08-25 LAB — GC/CHLAMYDIA PROBE AMP
Chlamydia trachomatis, NAA: NEGATIVE
Neisseria Gonorrhoeae by PCR: NEGATIVE

## 2022-10-06 ENCOUNTER — Ambulatory Visit: Payer: Self-pay | Admitting: Adult Health

## 2022-10-06 DIAGNOSIS — Z539 Procedure and treatment not carried out, unspecified reason: Secondary | ICD-10-CM

## 2023-07-19 IMAGING — DX DG ABDOMEN 1V
2 series · 2 of 2 positions shown · non-contrast
Comparison: Abdominal x-ray 03/02/2015.

CLINICAL DATA: Abdominal pain.

EXAM:
ABDOMEN - 1 VIEW

[abdomen kub (1 of 2)]
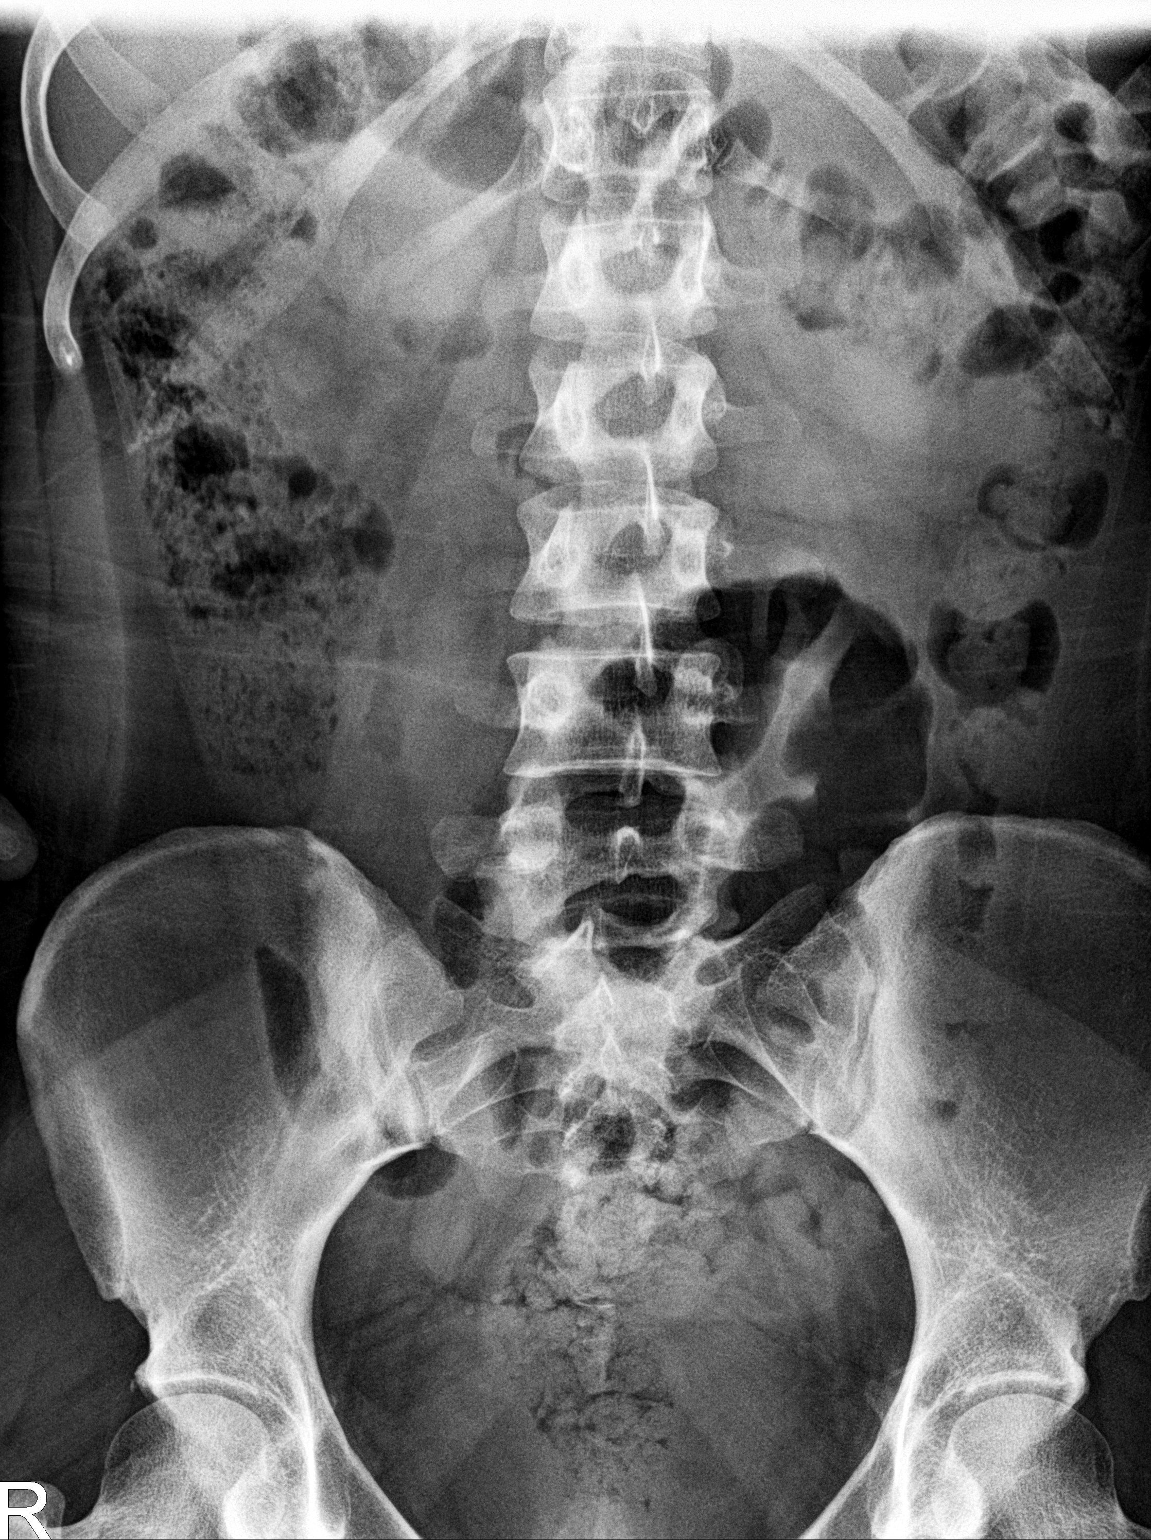

[abdomen kub (2 of 2)]
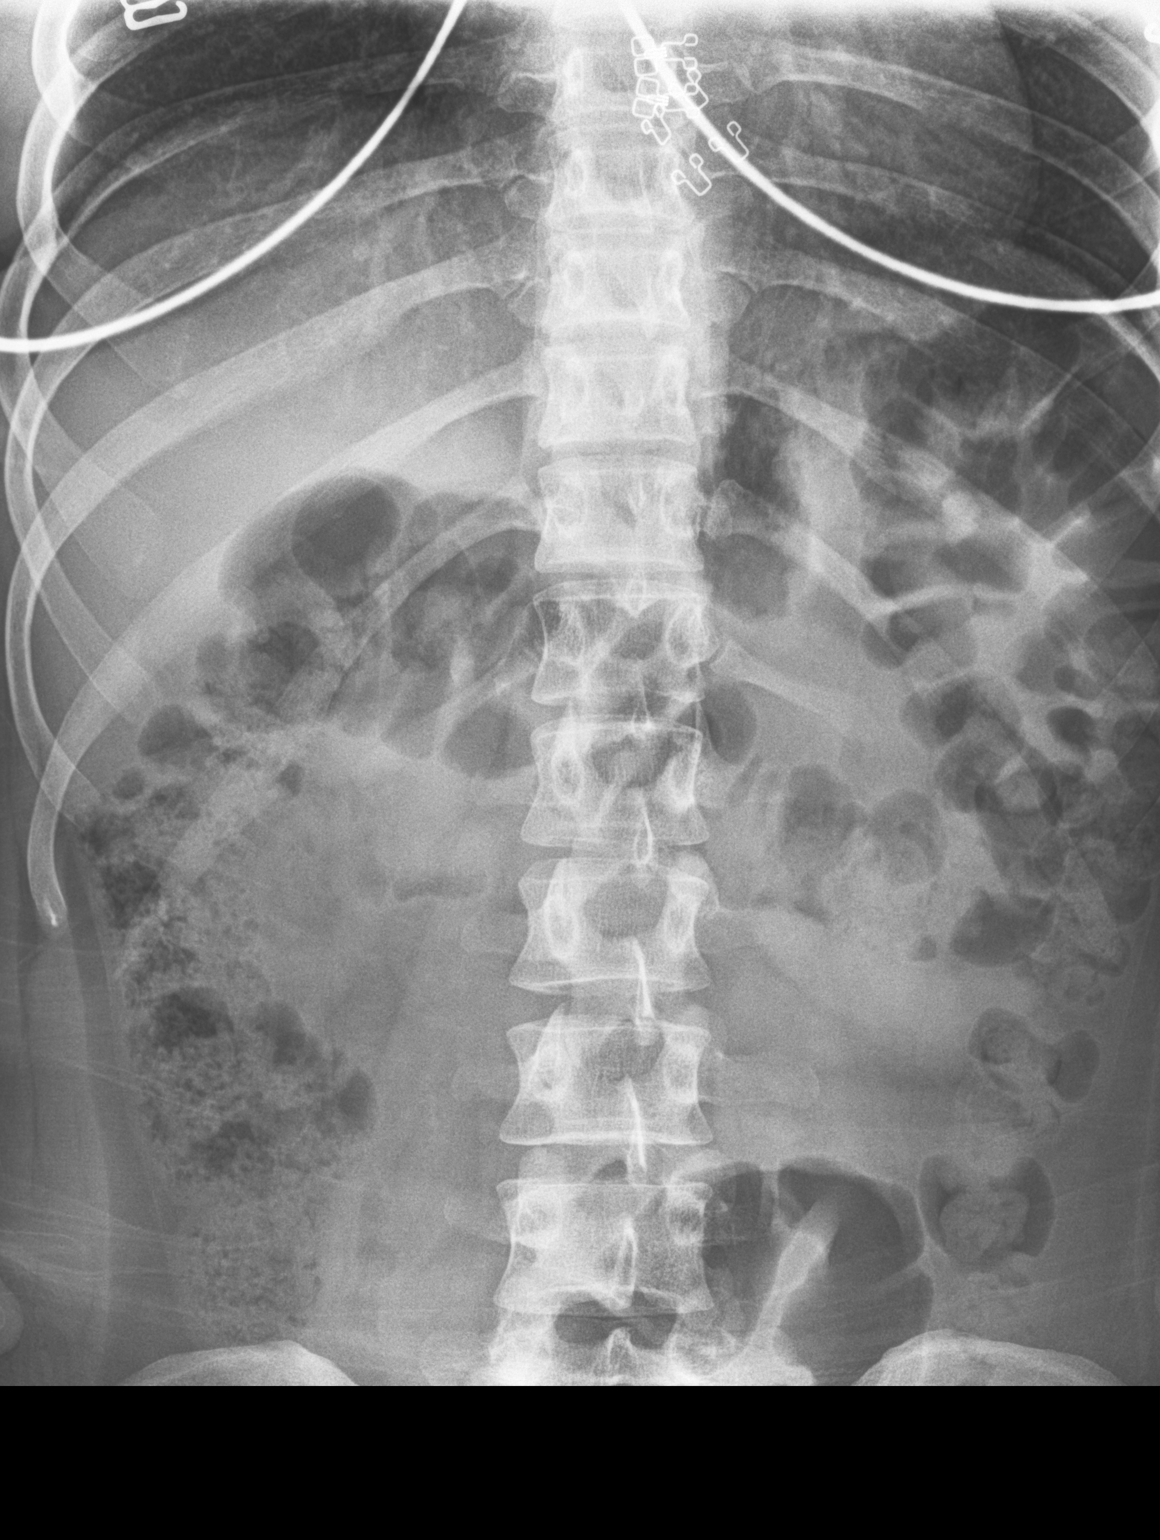

[2 of 2 positions shown; findings below may reference images not displayed]

FINDINGS: The bowel gas pattern is normal. There is a large amount of stool
throughout the colon. No radio-opaque calculi or other significant
radiographic abnormality are seen.
IMPRESSION: 1. Nonobstructive bowel gas pattern.  Large stool burden.

## 2023-11-16 ENCOUNTER — Other Ambulatory Visit: Payer: Self-pay | Admitting: Adult Health

## 2023-11-22 NOTE — L&D Delivery Note (Signed)
 Pamela [name] is a 28 y.o. female G1P0000 with IUP at [redacted]w[redacted]d admitted for  .  She progressed with/without augmentation to complete and pushed ~1 hour to deliver.  Cord clamping delayed by 1-3 minutes then clamped by CNM and cut by FOB.  Placenta intact and spontaneous, bleeding minimal. First degree laceration repaired without difficulty.  Mom and baby stable prior to transfer to postpartum. She plans on breastfeeding. She requests Nexplanon  for birth control.   Delivery Note At 7:59 PM a viable and healthy female was delivered via Vaginal, Spontaneous (Presentation: Left Occiput Anterior).  APGAR: 9, 9; weight 7 lb 1.9 oz (3230 g).   Placenta status: Spontaneous, Intact.  Cord: 3 vessels with the following complications: Short.    Anesthesia: Epidural Episiotomy: None Lacerations: 1st degree;Perineal Suture Repair: 3.0 monocryl Est. Blood Loss (mL): 104  Mom to postpartum.  Baby to Couplet care / Skin to Skin.  Olam Boards 07/19/2024, 8:48 PM

## 2023-11-29 NOTE — ED Notes (Signed)
 This nurse was unable to discharge this patient. Per registration, this patient walked out of the ED at 1110

## 2023-11-29 NOTE — ED Triage Notes (Signed)
 Patient states she took a pregnancy test last night and this morning and both were positive. Patient denies any pain, patient would just like to confirm possible pregnancy.

## 2023-11-29 NOTE — ED Provider Notes (Signed)
 [facility]  Emergency Department Provider Note     History   Chief Complaint Positive urine pregnancy test at home   HPI  Pamela [name] is a 28 y.o. female who had a positive urine pregnancy test at home and in order to get help at the health department with follow-up on the pregnancy she needs a blood test confirmation.  Patient does not have any symptoms of abdominal pain vaginal bleeding fever chills coughing runny nose sore throat abdominal pain shortness of breath vaginal discharge or foul odor.  Denies any risk factors for STD.  Has a same partner.  Denies any vaginal discharge.  No rash.  Denies any change in bowel habit or pattern.    No past medical history on file.  Past Surgical History:  Procedure Laterality Date  . TONSILLECTOMY AND ADENOIDECTOMY      Prior to Admission medications   Medication Dose, Route, Frequency  baclofen (LIORESAL) 10 MG tablet 10 mg, Oral, 2 times a day PRN  etonogestrel  (NEXPLANON ) 68 mg Impl 1 each, Subcutaneous  sumatriptan (IMITREX) 50 MG tablet 50 mg, Oral, Daily    Allergies Penicillins   Social History   Tobacco Use  . Smoking status: Every Day    Current packs/day: 0.50    Types: Cigarettes  . Smokeless tobacco: Never  Vaping Use  . Vaping status: Never Used  Substance Use Topics  . Alcohol use: No  . Drug use: No    Review of Systems  All other systems reviewed and are negative.   As in HPI, all systems reviewed and otherwise negative.  Physical Exam    Vitals:   11/29/23 0922  BP: 133/75  Pulse: 82  Resp: 18  Temp: 36.3 C (97.4 F)  TempSrc: Temporal  SpO2: 99%  Weight: 77.7 kg (171 lb 6.4 oz)  Height: 157.5 cm (5' 2)     Physical Exam  Constitutional: Patient appears well-developed and well nourished. Non toxic in appearance. HEENT: Unremarkable. Head: Atraumatic.  Eyes: Normal ocular movements. Neck: Supple with normal range of motion.  Pulmonary/Chest: Effort normal. No  respiratory distress. Abdominal: Soft and non tender abdomen. Musculoskeletal: Extremities atraumatic. Neurological: Alert with no focal neurological deficit. Ambulatory with a steady gait. Skin: Warm and dry.  Nursing note and vital signs reviewed.   ED Course        No orders of the defined types were placed in this encounter.    Medical Decision Making   I have reviewed the vital signs and the nursing notes. Labs and radiology results that were available during my care of the patient were independently reviewed by me and considered in my medical decision making.      Medical Decision Making    Differential Diagnosis: Pregnancy, confirmation of pregnancy, urine test revealing pregnancy      Radiology   No orders to display    ED Clinical Impression   Final diagnoses:  None  New onset pregnancy  Procedures      This record has been created using [employer]. Chart creation errors have been sought, but may not always have been located. Such creation errors do not reflect on the standard of medical care.   Maree Jonelle Lash, MD 11/29/23 [phone]

## 2023-12-01 ENCOUNTER — Emergency Department (HOSPITAL_COMMUNITY)
Admission: EM | Admit: 2023-12-01 | Discharge: 2023-12-01 | Disposition: A | Discharge status: Home / Self Care | Payer: Medicaid Other | Attending: Emergency Medicine | Admitting: Emergency Medicine

## 2023-12-01 ENCOUNTER — Other Ambulatory Visit: Payer: Self-pay

## 2023-12-01 ENCOUNTER — Encounter (HOSPITAL_COMMUNITY): Payer: Self-pay | Admitting: *Deleted

## 2023-12-01 ENCOUNTER — Emergency Department (HOSPITAL_COMMUNITY): Payer: Medicaid Other

## 2023-12-01 DIAGNOSIS — O209 Hemorrhage in early pregnancy, unspecified: Secondary | ICD-10-CM | POA: Insufficient documentation

## 2023-12-01 DIAGNOSIS — O219 Vomiting of pregnancy, unspecified: Secondary | ICD-10-CM | POA: Diagnosis not present

## 2023-12-01 DIAGNOSIS — N939 Abnormal uterine and vaginal bleeding, unspecified: Secondary | ICD-10-CM

## 2023-12-01 DIAGNOSIS — Z3A01 Less than 8 weeks gestation of pregnancy: Secondary | ICD-10-CM | POA: Insufficient documentation

## 2023-12-01 LAB — CBC
HCT: 41.4 % (ref 36.0–46.0)
Hemoglobin: 14.4 g/dL (ref 12.0–15.0)
MCH: 31.9 pg (ref 26.0–34.0)
MCHC: 34.8 g/dL (ref 30.0–36.0)
MCV: 91.6 fL (ref 80.0–100.0)
Platelets: 296 10*3/uL (ref 150–400)
RBC: 4.52 MIL/uL (ref 3.87–5.11)
RDW: 12.1 % (ref 11.5–15.5)
WBC: 13.3 10*3/uL — ABNORMAL HIGH (ref 4.0–10.5)
nRBC: 0 % (ref 0.0–0.2)

## 2023-12-01 LAB — TYPE AND SCREEN
ABO/RH(D): O NEG
Antibody Screen: NEGATIVE

## 2023-12-01 LAB — ABO/RH: ABO/RH(D): O NEG

## 2023-12-01 LAB — HCG, QUANTITATIVE, PREGNANCY: hCG, Beta Chain, Quant, S: 62605 m[IU]/mL — ABNORMAL HIGH (ref <5)

## 2023-12-01 NOTE — ED Triage Notes (Signed)
 Pt found she is pregnant two days ago, started this morning.  Denies any clots noted, cramping at times.

## 2023-12-01 NOTE — ED Notes (Signed)
Pelvic done

## 2023-12-01 NOTE — ED Notes (Addendum)
 Patient states LMP was 10/31/2023, and she is having light bleeding, she has only had to change her pad one time since yesterday.

## 2023-12-01 NOTE — Discharge Instructions (Addendum)
 Was a pleasure taking care of you today.  You were evaluated in the emergency room for vaginal bleeding.  An ultrasound was performed which showed a single live intrauterine pregnancy with a small bleed.  Please call your OB/GYN and see if you can move your appointment up to within the next 3 to 5 days.  If you experience any new or worsening symptoms including worsening bleeding, dizziness, fevers or chills please return to the emergency room.

## 2023-12-01 NOTE — ED Provider Notes (Signed)
 [place] EMERGENCY DEPARTMENT AT [facility] Provider Note   CSN: [id number] Arrival date & time: 12/01/23  1001     History  Chief Complaint  Patient presents with   Vaginal Bleeding    Pamela [name] is a 28 y.o. female who presents with vaginal bleeding that started this morning.  No clots.  Patient just found out on two days ago that she was pregnant.  This is her first pregnancy.  She believes he is roughly a month and gestation.  She has been vomiting without abdominal pain.  Reports some cramping.  No injury or trauma reported.    Vaginal Bleeding      Home Medications Prior to Admission medications   Medication Sig Start Date End Date Taking? Authorizing Provider  baclofen (LIORESAL) 10 MG tablet Take 10 mg by mouth 2 (two) times daily as needed. 09/25/23  Yes [provider]  fluconazole  (DIFLUCAN ) 150 MG tablet TAKE 1 TABLET BY MOUTH now AND THEN REPEAT ONE in THREE DAYS 11/20/23   Signa Nest A, NP  norethindrone-ethinyl estradiol  (LOESTRIN FE) 1-20 MG-MCG tablet Take 1 tablet by mouth daily. 01/21/21 06/24/22  Signa Nest LABOR, NP  SUMAtriptan (IMITREX) 50 MG tablet Take 50 mg by mouth every morning. 09/25/23   [provider]      Allergies    Penicillins    Review of Systems   Review of Systems  Genitourinary:  Positive for vaginal bleeding.    Physical Exam Updated Vital Signs BP 122/86 (BP Location: Right Arm)   Pulse (!) 59   Temp 99.4 F (37.4 C) (Oral)   Resp 16   Ht 5' 3 (1.6 m)   Wt 77.1 kg   LMP  (LMP Unknown)   SpO2 100%   BMI 30.11 kg/m  Physical Exam Vitals and nursing note reviewed. Exam conducted with a chaperone present.  Constitutional:      General: She is not in acute distress.    Appearance: She is well-developed.  HENT:     Head: Normocephalic and atraumatic.  Eyes:     Conjunctiva/sclera: Conjunctivae normal.  Cardiovascular:     Rate and Rhythm: Normal rate and regular rhythm.      Heart sounds: No murmur heard. Pulmonary:     Effort: Pulmonary effort is normal. No respiratory distress.     Breath sounds: Normal breath sounds.  Abdominal:     Palpations: Abdomen is soft.     Tenderness: There is no abdominal tenderness.  Genitourinary:    Comments: Pelvic exam with some mild discomfort, closed cervical os with scant bleeding in vaginal vault Musculoskeletal:        General: No swelling.     Cervical back: Neck supple.  Skin:    General: Skin is warm and dry.     Capillary Refill: Capillary refill takes less than 2 seconds.  Neurological:     Mental Status: She is alert.  Psychiatric:        Mood and Affect: Mood normal.     ED Results / Procedures / Treatments   Labs (all labs ordered are listed, but only abnormal results are displayed) Labs Reviewed  CBC - Abnormal; Notable for the following components:      Result Value   WBC 13.3 (*)    All other components within normal limits  HCG, QUANTITATIVE, PREGNANCY - Abnormal; Notable for the following components:   hCG, Beta Chain, Quant, S 62,605 (*)    All other components within  normal limits  TYPE AND SCREEN  ABO/RH    EKG None  Radiology US  OB Comp < 14 Wks Result Date: 12/01/2023 CLINICAL DATA:  Vaginal bleeding.  Positive pregnancy test. EXAM: OBSTETRIC <14 WK US  AND TRANSVAGINAL OB US  TECHNIQUE: Both transabdominal and transvaginal ultrasound examinations were performed for complete evaluation of the gestation as well as the maternal uterus, adnexal regions, and pelvic cul-de-sac. Transvaginal technique was performed to assess early pregnancy. COMPARISON:  None Available. FINDINGS: Intrauterine gestational sac: Single Yolk sac:  Visualized. Embryo:  Visualized. Cardiac Activity: Visualized. Heart Rate: 143 bpm CRL:  5.6 mm   6 w   2 d                  US  EDC: 07/24/2024 Subchorionic hemorrhage:  Small Maternal uterus/adnexae: Normal ovaries bilaterally. No free fluid in the pelvis. IMPRESSION:  Single live intrauterine gestation.  Small subchorionic hemorrhage. Electronically Signed   By: Bard Moats M.D.   On: 12/01/2023 13:22   US  OB Transvaginal Result Date: 12/01/2023 CLINICAL DATA:  Vaginal bleeding.  Positive pregnancy test. EXAM: OBSTETRIC <14 WK US  AND TRANSVAGINAL OB US  TECHNIQUE: Both transabdominal and transvaginal ultrasound examinations were performed for complete evaluation of the gestation as well as the maternal uterus, adnexal regions, and pelvic cul-de-sac. Transvaginal technique was performed to assess early pregnancy. COMPARISON:  None Available. FINDINGS: Intrauterine gestational sac: Single Yolk sac:  Visualized. Embryo:  Visualized. Cardiac Activity: Visualized. Heart Rate: 143 bpm CRL:  5.6 mm   6 w   2 d                  US  EDC: 07/24/2024 Subchorionic hemorrhage:  Small Maternal uterus/adnexae: Normal ovaries bilaterally. No free fluid in the pelvis. IMPRESSION: Single live intrauterine gestation.  Small subchorionic hemorrhage. Electronically Signed   By: Bard Moats M.D.   On: 12/01/2023 13:22    Procedures Procedures    Medications Ordered in ED Medications - No data to display  ED Course/ Medical Decision Making/ A&P                                 Medical Decision Making Amount and/or Complexity of Data Reviewed Labs: ordered. Radiology: ordered.   This patient presents to the ED with chief complaint(s) of vaginal bleeding .  The complaint involves an extensive differential diagnosis and also carries with it a high risk of complications and morbidity.   pertinent past medical history as listed in HPI  The differential diagnosis includes  Threatened vs inevitable vs missed abortion,ectopic pregnancy  The initial plan is to  Obtain basic labs, ultrasound Additional history obtained:  Records reviewed Care Everywhere/External Records  Initial Assessment:   Nontoxic-appearing patient presenting with 1 day of scant vaginal bleeding without clots,  first pregnancy.  She is otherwise asymptomatic.  Independent ECG interpretation:  none  Independent labs interpretation:  The following labs were independently interpreted:  CBC with very mild leukocytosis of 13.3, hCG 62,000  Independent visualization and interpretation of imaging: I independently visualized the following imaging with scope of interpretation limited to determining acute life threatening conditions related to emergency care: Ultrasound, which revealed single IUP small subchorionic hemorrhage  Treatment and Reassessment: No Medications administered during visit  Consultations obtained:   None  Disposition:   Patient discharged home, she has an appointment with OB/GYN next week.  Encouraged to call to see if she can move up  her appointment. The patient has been appropriately medically screened and/or stabilized in the ED. I have low suspicion for any other emergent medical condition which would require further screening, evaluation or treatment in the ED or require inpatient management. At time of discharge the patient is hemodynamically stable and in no acute distress. I have discussed work-up results and diagnosis with patient and answered all questions. Patient is agreeable with discharge plan. We discussed strict return precautions for returning to the emergency department and they verbalized understanding.     Social Determinants of Health:   none  This note was dictated with voice recognition software.  Despite best efforts at proofreading, errors may have occurred which can change the documentation meaning.          Final Clinical Impression(s) / ED Diagnoses Final diagnoses:  Vaginal bleeding    Rx / DC Orders ED Discharge Orders     None         Donnajean Lynwood DEL, PA-C 12/01/23 1551    Suzette Pac, MD 12/04/23 1126

## 2023-12-04 ENCOUNTER — Telehealth: Payer: Self-pay

## 2023-12-04 MED ORDER — ONDANSETRON HCL 4 MG PO TABS: 4.0000 mg | ORAL_TABLET | Freq: Three times a day (TID) | ORAL | 1 refills | Status: DC | PRN

## 2023-12-04 NOTE — Telephone Encounter (Signed)
 Returned patient's call. States she went to the ED on 01/10 for vaginal bleeding. Since then, bleeding has been light pink only when wiping after using the bathroom. Last intercourse was last week and denies excessive lifting, pulling, straining with BM.  Discussed with JAG. Patient to keep US  as scheduled on 01/16 and will need Rhogam injection. Can be before then or at that visit.  Pt to find out blood type of FOB.     Also requesting nausea medication. Advised to check back with pharmacy later this afternoon.

## 2023-12-04 NOTE — Telephone Encounter (Signed)
Will rx zofran °

## 2023-12-04 NOTE — Addendum Note (Signed)
 Addended by: Cyril Mourning A on: 12/04/2023 12:46 PM   Modules accepted: Orders

## 2023-12-04 NOTE — Telephone Encounter (Signed)
 Patient went to [facility] on 1/10. She was told she needed to be seen within 5 days. Please advise.

## 2023-12-06 ENCOUNTER — Other Ambulatory Visit: Payer: Self-pay | Admitting: Obstetrics & Gynecology

## 2023-12-06 DIAGNOSIS — O4690 Antepartum hemorrhage, unspecified, unspecified trimester: Secondary | ICD-10-CM

## 2023-12-07 ENCOUNTER — Other Ambulatory Visit: Payer: Medicaid Other | Admitting: *Deleted

## 2023-12-07 ENCOUNTER — Ambulatory Visit: Payer: Medicaid Other

## 2023-12-07 ENCOUNTER — Other Ambulatory Visit (HOSPITAL_COMMUNITY)
Admission: RE | Admit: 2023-12-07 | Discharge: 2023-12-07 | Disposition: A | Discharge status: Home / Self Care | Payer: Medicaid Other | Source: Ambulatory Visit | Attending: Obstetrics & Gynecology | Admitting: Obstetrics & Gynecology

## 2023-12-07 DIAGNOSIS — O26891 Other specified pregnancy related conditions, first trimester: Secondary | ICD-10-CM | POA: Diagnosis present

## 2023-12-07 DIAGNOSIS — O4690 Antepartum hemorrhage, unspecified, unspecified trimester: Secondary | ICD-10-CM

## 2023-12-07 DIAGNOSIS — Z3A01 Less than 8 weeks gestation of pregnancy: Secondary | ICD-10-CM | POA: Diagnosis not present

## 2023-12-07 DIAGNOSIS — N898 Other specified noninflammatory disorders of vagina: Secondary | ICD-10-CM

## 2023-12-07 DIAGNOSIS — O26899 Other specified pregnancy related conditions, unspecified trimester: Secondary | ICD-10-CM | POA: Insufficient documentation

## 2023-12-07 DIAGNOSIS — O4691 Antepartum hemorrhage, unspecified, first trimester: Secondary | ICD-10-CM | POA: Diagnosis not present

## 2023-12-07 DIAGNOSIS — Z6791 Unspecified blood type, Rh negative: Secondary | ICD-10-CM | POA: Insufficient documentation

## 2023-12-07 NOTE — Progress Notes (Signed)
   NURSE VISIT- INJECTION  SUBJECTIVE:  Pamela [name] is a 28 y.o. 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 She is [redacted]w[redacted]d pregnant with recent episode of bleeding at the end of December.  Denies any current bleeding but does have c/o white vaginal discharge with itching. States this would make her third yeast infection in the last couple of months.  Wants to do a swab to check.  OBJECTIVE:  LMP 10/31/2023   Appears well, in no apparent distress  Injection administered in: Right upper quad. Gluteus Self swab collected  No orders of the defined types were placed in this encounter.   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

## 2023-12-07 NOTE — Progress Notes (Signed)
[place] 7+1 wks,single IUP with yolk sac,FHR 152 bpm,CRL 10.31 mm,normal ovaries

## 2023-12-11 LAB — CERVICOVAGINAL ANCILLARY ONLY
Bacterial Vaginitis (gardnerella): NEGATIVE
Candida Glabrata: NEGATIVE
Candida Vaginitis: NEGATIVE
Chlamydia: NEGATIVE
Comment: NEGATIVE
Comment: NEGATIVE
Comment: NEGATIVE
Comment: NEGATIVE
Comment: NEGATIVE
Comment: NORMAL
Neisseria Gonorrhea: NEGATIVE
Trichomonas: POSITIVE — AB

## 2023-12-12 ENCOUNTER — Other Ambulatory Visit: Payer: Self-pay | Admitting: Adult Health

## 2023-12-12 ENCOUNTER — Encounter: Payer: Self-pay | Admitting: Adult Health

## 2023-12-12 DIAGNOSIS — A599 Trichomoniasis, unspecified: Secondary | ICD-10-CM | POA: Insufficient documentation

## 2023-12-12 MED ORDER — METRONIDAZOLE 500 MG PO TABS: 500.0000 mg | ORAL_TABLET | Freq: Two times a day (BID) | ORAL | 0 refills | Status: DC

## 2023-12-12 NOTE — Progress Notes (Signed)
+  trich on vaginal swab, rx sent in for flagyl, no sex or alcohol while taking. Trich or trichomonas is a STD so you partner needs treating too. He can see his MD or go to health dept or I can treat, will need name,dob,any allergies and drug store.

## 2023-12-18 ENCOUNTER — Telehealth: Payer: Self-pay | Admitting: Adult Health

## 2023-12-18 ENCOUNTER — Telehealth: Payer: Self-pay

## 2023-12-18 MED ORDER — ONDANSETRON HCL 4 MG PO TABS: 4.0000 mg | ORAL_TABLET | Freq: Three times a day (TID) | ORAL | 1 refills | Status: DC | PRN

## 2023-12-18 NOTE — Addendum Note (Signed)
Addended by: Cyril Mourning A on: 12/18/2023 12:11 PM   Modules accepted: Orders

## 2023-12-18 NOTE — Telephone Encounter (Signed)
Patient would like a rx for Zolfran, she is so sick she is unable to work.

## 2023-12-18 NOTE — Telephone Encounter (Signed)
Patient would like a refill of Zofran sent to the [facility] in [place]. Please advise.

## 2023-12-18 NOTE — Telephone Encounter (Signed)
Refilled zofran

## 2023-12-18 NOTE — Telephone Encounter (Signed)
Medication sent in.

## 2024-01-09 ENCOUNTER — Other Ambulatory Visit: Payer: Self-pay | Admitting: Obstetrics & Gynecology

## 2024-01-09 ENCOUNTER — Other Ambulatory Visit: Payer: Self-pay | Admitting: Adult Health

## 2024-01-09 ENCOUNTER — Encounter: Payer: Self-pay | Admitting: Advanced Practice Midwife

## 2024-01-09 DIAGNOSIS — Z3682 Encounter for antenatal screening for nuchal translucency: Secondary | ICD-10-CM

## 2024-01-09 MED ORDER — ONDANSETRON HCL 4 MG PO TABS: 4.0000 mg | ORAL_TABLET | Freq: Three times a day (TID) | ORAL | 1 refills | Status: DC | PRN

## 2024-01-09 NOTE — Progress Notes (Signed)
 Refilled zofran

## 2024-01-10 ENCOUNTER — Other Ambulatory Visit (HOSPITAL_COMMUNITY)
Admission: RE | Admit: 2024-01-10 | Discharge: 2024-01-10 | Disposition: A | Discharge status: Home / Self Care | Payer: Medicaid Other | Source: Ambulatory Visit | Attending: Advanced Practice Midwife | Admitting: Advanced Practice Midwife

## 2024-01-10 ENCOUNTER — Encounter: Payer: Self-pay | Admitting: Advanced Practice Midwife

## 2024-01-10 ENCOUNTER — Encounter: Payer: Medicaid Other | Admitting: *Deleted

## 2024-01-10 ENCOUNTER — Ambulatory Visit: Payer: Medicaid Other | Admitting: Advanced Practice Midwife

## 2024-01-10 ENCOUNTER — Ambulatory Visit: Payer: Medicaid Other

## 2024-01-10 VITALS — BP 117/76 | HR 70 | Wt 163.0 lb

## 2024-01-10 DIAGNOSIS — Z113 Encounter for screening for infections with a predominantly sexual mode of transmission: Secondary | ICD-10-CM

## 2024-01-10 DIAGNOSIS — Z124 Encounter for screening for malignant neoplasm of cervix: Secondary | ICD-10-CM

## 2024-01-10 DIAGNOSIS — Z131 Encounter for screening for diabetes mellitus: Secondary | ICD-10-CM

## 2024-01-10 DIAGNOSIS — O0990 Supervision of high risk pregnancy, unspecified, unspecified trimester: Secondary | ICD-10-CM | POA: Insufficient documentation

## 2024-01-10 DIAGNOSIS — Z3A12 12 weeks gestation of pregnancy: Secondary | ICD-10-CM

## 2024-01-10 DIAGNOSIS — Z6791 Unspecified blood type, Rh negative: Secondary | ICD-10-CM | POA: Diagnosis not present

## 2024-01-10 DIAGNOSIS — O26891 Other specified pregnancy related conditions, first trimester: Secondary | ICD-10-CM

## 2024-01-10 DIAGNOSIS — Z3401 Encounter for supervision of normal first pregnancy, first trimester: Secondary | ICD-10-CM

## 2024-01-10 DIAGNOSIS — Z3682 Encounter for antenatal screening for nuchal translucency: Secondary | ICD-10-CM

## 2024-01-10 DIAGNOSIS — O26899 Other specified pregnancy related conditions, unspecified trimester: Secondary | ICD-10-CM

## 2024-01-10 DIAGNOSIS — Z363 Encounter for antenatal screening for malformations: Secondary | ICD-10-CM

## 2024-01-10 DIAGNOSIS — A599 Trichomoniasis, unspecified: Secondary | ICD-10-CM

## 2024-01-10 DIAGNOSIS — Z3400 Encounter for supervision of normal first pregnancy, unspecified trimester: Secondary | ICD-10-CM | POA: Insufficient documentation

## 2024-01-10 DIAGNOSIS — Z6830 Body mass index (BMI) 30.0-30.9, adult: Secondary | ICD-10-CM

## 2024-01-10 MED ORDER — BLOOD PRESSURE MONITOR MISC: 0 refills | Status: DC

## 2024-01-10 MED ORDER — ASPIRIN 81 MG PO CHEW: 162.0000 mg | CHEWABLE_TABLET | Freq: Every day | ORAL | 7 refills | Status: DC

## 2024-01-10 NOTE — Patient Instructions (Signed)
Pamela [name], thank you for choosing our office today! We appreciate the opportunity to meet your healthcare needs. You may receive a short survey by mail, e-mail, or through [employer]. If you are happy with your care we would appreciate if you could take just a few minutes to complete the survey questions. We read all of your comments and take your feedback very seriously. Thank you again for choosing our office.  [facility] [employer] [facility] at [facility]  [facility] at [facility] ([address] [place], [place] [address]) Entrance C, located off of [address] Free 24/7 valet parking   Nausea & Vomiting Have saltine crackers or pretzels by your bed and eat a few bites before you raise your head out of bed in the morning Eat small frequent meals throughout the day instead of large meals Drink plenty of fluids throughout the day to stay hydrated, just don't drink a lot of fluids with your meals.  This can make your stomach fill up faster making you feel sick Do not brush your teeth right after you eat Products with real ginger are good for nausea, like ginger ale and ginger hard candy Make sure it says made with real ginger! Sucking on sour candy like lemon heads is also good for nausea If your prenatal vitamins make you nauseated, take them at night so you will sleep through the nausea Sea Bands If you feel like you need medicine for the nausea & vomiting please let us know If you are unable to keep any fluids or food down please let us know   Constipation Drink plenty of fluid, preferably water, throughout the day Eat foods high in fiber such as fruits, vegetables, and grains Exercise, such as walking, is a good way to keep your bowels regular Drink warm fluids, especially warm prune juice, or decaf coffee Eat a 1/2 cup of real oatmeal (not instant), 1/2 cup applesauce, and 1/2-1 cup warm prune juice every day If needed, you may take Colace (docusate sodium) stool softener  once or twice a day to help keep the stool soft.  If you still are having problems with constipation, you may take Miralax once daily as needed to help keep your bowels regular.   Home Blood Pressure Monitoring for Patients   Your provider has recommended that you check your blood pressure (BP) at least once a week at home. If you do not have a blood pressure cuff at home, one will be provided for you. Contact your provider if you have not received your monitor within 1 week.   Helpful Tips for Accurate Home Blood Pressure Checks  Don't smoke, exercise, or drink caffeine 30 minutes before checking your BP Use the restroom before checking your BP (a full bladder can raise your pressure) Relax in a comfortable upright chair Feet on the ground Left arm resting comfortably on a flat surface at the level of your heart Legs uncrossed Back supported Sit quietly and don't talk Place the cuff on your bare arm Adjust snuggly, so that only two fingertips can fit between your skin and the top of the cuff Check 2 readings separated by at least one minute Keep a log of your BP readings For a visual, please reference this diagram: http://ccnc.care/bpdiagram  Provider Name: [facility]     Phone: [phone]  Zone 1: ALL CLEAR  Continue to monitor your symptoms:  BP reading is less than 140 (top number) or less than 90 (bottom  number)  No right upper stomach pain No headaches or seeing spots No feeling nauseated or throwing up No swelling in face and hands  Zone 2: CAUTION Call your doctor's office for any of the following:  BP reading is greater than 140 (top number) or greater than 90 (bottom number)  Stomach pain under your ribs in the middle or right side Headaches or seeing spots Feeling nauseated or throwing up Swelling in face and hands  Zone 3: EMERGENCY  Seek immediate medical care if you have any of the following:  BP reading is greater than160 (top number) or greater than  110 (bottom number) Severe headaches not improving with Tylenol Serious difficulty catching your breath Any worsening symptoms from Zone 2    First Trimester of Pregnancy The first trimester of pregnancy is from week 1 until the end of week 12 (months 1 through 3). A week after a sperm fertilizes an egg, the egg will implant on the wall of the uterus. This embryo will begin to develop into a baby. Genes from you and your partner are forming the baby. The female genes determine whether the baby is a boy or a girl. At 6-8 weeks, the eyes and face are formed, and the heartbeat can be seen on ultrasound. At the end of 12 weeks, all the baby's organs are formed.  Now that you are pregnant, you will want to do everything you can to have a healthy baby. Two of the most important things are to get good prenatal care and to follow your health care provider's instructions. Prenatal care is all the medical care you receive before the baby's birth. This care will help prevent, find, and treat any problems during the pregnancy and childbirth. BODY CHANGES Your body goes through many changes during pregnancy. The changes vary from woman to woman.  You may gain or lose a couple of pounds at first. You may feel sick to your stomach (nauseous) and throw up (vomit). If the vomiting is uncontrollable, call your health care provider. You may tire easily. You may develop headaches that can be relieved by medicines approved by your health care provider. You may urinate more often. Painful urination may mean you have a bladder infection. You may develop heartburn as a result of your pregnancy. You may develop constipation because certain hormones are causing the muscles that push waste through your intestines to slow down. You may develop hemorrhoids or swollen, bulging veins (varicose veins). Your breasts may begin to grow larger and become tender. Your nipples may stick out more, and the tissue that surrounds them  (areola) may become darker. Your gums may bleed and may be sensitive to brushing and flossing. Dark spots or blotches (chloasma, mask of pregnancy) may develop on your face. This will likely fade after the baby is born. Your menstrual periods will stop. You may have a loss of appetite. You may develop cravings for certain kinds of food. You may have changes in your emotions from day to day, such as being excited to be pregnant or being concerned that something may go wrong with the pregnancy and baby. You may have more vivid and strange dreams. You may have changes in your hair. These can include thickening of your hair, rapid growth, and changes in texture. Some women also have hair loss during or after pregnancy, or hair that feels dry or thin. Your hair will most likely return to normal after your baby is born. WHAT TO EXPECT AT YOUR PRENATAL  VISITS During a routine prenatal visit: You will be weighed to make sure you and the baby are growing normally. Your blood pressure will be taken. Your abdomen will be measured to track your baby's growth. The fetal heartbeat will be listened to starting around week 10 or 12 of your pregnancy. Test results from any previous visits will be discussed. Your health care provider may ask you: How you are feeling. If you are feeling the baby move. If you have had any abnormal symptoms, such as leaking fluid, bleeding, severe headaches, or abdominal cramping. If you have any questions. Other tests that may be performed during your first trimester include: Blood tests to find your blood type and to check for the presence of any previous infections. They will also be used to check for low iron levels (anemia) and Rh antibodies. Later in the pregnancy, blood tests for diabetes will be done along with other tests if problems develop. Urine tests to check for infections, diabetes, or protein in the urine. An ultrasound to confirm the proper growth and development  of the baby. An amniocentesis to check for possible genetic problems. Fetal screens for spina bifida and Down syndrome. You may need other tests to make sure you and the baby are doing well. HOME CARE INSTRUCTIONS  Medicines Follow your health care provider's instructions regarding medicine use. Specific medicines may be either safe or unsafe to take during pregnancy. Take your prenatal vitamins as directed. If you develop constipation, try taking a stool softener if your health care provider approves. Diet Eat regular, well-balanced meals. Choose a variety of foods, such as meat or vegetable-based protein, fish, milk and low-fat dairy products, vegetables, fruits, and whole grain breads and cereals. Your health care provider will help you determine the amount of weight gain that is right for you. Avoid raw meat and uncooked cheese. These carry germs that can cause birth defects in the baby. Eating four or five small meals rather than three large meals a day may help relieve nausea and vomiting. If you start to feel nauseous, eating a few soda crackers can be helpful. Drinking liquids between meals instead of during meals also seems to help nausea and vomiting. If you develop constipation, eat more high-fiber foods, such as fresh vegetables or fruit and whole grains. Drink enough fluids to keep your urine clear or pale yellow. Activity and Exercise Exercise only as directed by your health care provider. Exercising will help you: Control your weight. Stay in shape. Be prepared for labor and delivery. Experiencing pain or cramping in the lower abdomen or low back is a good sign that you should stop exercising. Check with your health care provider before continuing normal exercises. Try to avoid standing for long periods of time. Move your legs often if you must stand in one place for a long time. Avoid heavy lifting. Wear low-heeled shoes, and practice good posture. You may continue to have sex  unless your health care provider directs you otherwise. Relief of Pain or Discomfort Wear a good support bra for breast tenderness.   Take warm sitz baths to soothe any pain or discomfort caused by hemorrhoids. Use hemorrhoid cream if your health care provider approves.   Rest with your legs elevated if you have leg cramps or low back pain. If you develop varicose veins in your legs, wear support hose. Elevate your feet for 15 minutes, 3-4 times a day. Limit salt in your diet. Prenatal Care Schedule your prenatal visits by the  twelfth week of pregnancy. They are usually scheduled monthly at first, then more often in the last 2 months before delivery. Write down your questions. Take them to your prenatal visits. Keep all your prenatal visits as directed by your health care provider. Safety Wear your seat belt at all times when driving. Make a list of emergency phone numbers, including numbers for family, friends, the hospital, and police and fire departments. General Tips Ask your health care provider for a referral to a local prenatal education class. Begin classes no later than at the beginning of month 6 of your pregnancy. Ask for help if you have counseling or nutritional needs during pregnancy. Your health care provider can offer advice or refer you to specialists for help with various needs. Do not use hot tubs, steam rooms, or saunas. Do not douche or use tampons or scented sanitary pads. Do not cross your legs for long periods of time. Avoid cat litter boxes and soil used by cats. These carry germs that can cause birth defects in the baby and possibly loss of the fetus by miscarriage or stillbirth. Avoid all smoking, herbs, alcohol, and medicines not prescribed by your health care provider. Chemicals in these affect the formation and growth of the baby. Schedule a dentist appointment. At home, brush your teeth with a soft toothbrush and be gentle when you floss. SEEK MEDICAL CARE IF:   You have dizziness. You have mild pelvic cramps, pelvic pressure, or nagging pain in the abdominal area. You have persistent nausea, vomiting, or diarrhea. You have a bad smelling vaginal discharge. You have pain with urination. You notice increased swelling in your face, hands, legs, or ankles. SEEK IMMEDIATE MEDICAL CARE IF:  You have a fever. You are leaking fluid from your vagina. You have spotting or bleeding from your vagina. You have severe abdominal cramping or pain. You have rapid weight gain or loss. You vomit blood or material that looks like coffee grounds. You are exposed to [place] measles and have never had them. You are exposed to fifth disease or chickenpox. You develop a severe headache. You have shortness of breath. You have any kind of trauma, such as from a fall or a car accident. Document Released: 11/01/2001 Document Revised: 03/24/2014 Document Reviewed: 09/17/2013 [facility] Patient Information 2015 [place], [place]. This information is not intended to replace advice given to you by your health care provider. Make sure you discuss any questions you have with your health care provider.

## 2024-01-10 NOTE — Progress Notes (Signed)
INITIAL OBSTETRICAL VISIT Patient name: Pamela [name] MRN [id number]  Date of birth: [birth year] Chief Complaint:   Initial Prenatal Visit  History of Present Illness:   Pamela [name] is a 28 y.o. G12P0000 Caucasian female at [redacted]w[redacted]d by [place] at 7.1 weeks with an Estimated Date of Delivery: 07/24/24 being seen today for her initial obstetrical visit.   Patient's last menstrual period was 10/31/2023. Her obstetrical history is significant for primigravida.   Today she reports N/V.  Last pap unsure. Results were: N/A     01/10/2024   10:27 AM 07/02/2020    2:27 PM 07/24/2019    3:08 PM 06/28/2019    3:11 PM 12/27/2018   10:43 AM  Depression screen PHQ 2/9  Decreased Interest 0 2 0 2 0  Down, Depressed, Hopeless 0 1 0 2 0  PHQ - 2 Score 0 3 0 4 0  Altered sleeping 0 3 1 2  0  Tired, decreased energy 1 2 1 2  0  Change in appetite 0 3 0 0 0  Feeling bad or failure about yourself  0 1 0 1 0  Trouble concentrating 0 2 0 1 0  Moving slowly or fidgety/restless 0 0 0 0 0  Suicidal thoughts 0 0 0 0 0  PHQ-9 Score 1 14 2 10  0  Difficult doing work/chores   Not difficult at all          01/10/2024   10:27 AM 07/02/2020    2:29 PM 07/24/2019    3:11 PM 06/28/2019    3:11 PM  GAD 7 : Generalized Anxiety Score  Nervous, Anxious, on Edge 0 1 2 2   Control/stop worrying 0 1 0 1  Worry too much - different things 0 1 0 2  Trouble relaxing 0 1 0 3  Restless 0 0 0 0  Easily annoyed or irritable 0 1 0 2  Afraid - awful might happen 0 0 0 1  Total GAD 7 Score 0 5 2 11   Anxiety Difficulty   Not difficult at all      Review of Systems:   Pertinent items are noted in HPI Denies cramping/contractions, leakage of fluid, vaginal bleeding, abnormal vaginal discharge w/ itching/odor/irritation, headaches, visual changes, shortness of breath, chest pain, abdominal pain, severe nausea/vomiting, or problems with urination or bowel movements unless otherwise stated above.  Pertinent History Reviewed:  Reviewed  past medical,surgical, social, obstetrical and family history.  Reviewed problem list, medications and allergies. OB History  Gravida Para Term Preterm AB Living  1 0 0 0 0 0  SAB IAB Ectopic Multiple Live Births  0 0 0 0     # Outcome Date GA Lbr Len/2nd Weight Sex Type Anes PTL Lv  1 Current            Physical Assessment:   Vitals:   01/10/24 1051  BP: 117/76  Pulse: 70  Weight: 163 lb (73.9 kg)  Body mass index is 28.87 kg/m.       Physical Examination:  General appearance - well appearing, and in no distress  Mental status - alert, oriented to person, place, and time  Psych:  She has a normal mood and affect  Skin - warm and dry, normal color, no suspicious lesions noted  Chest - effort normal, all lung fields clear to auscultation bilaterally  Heart - normal rate and regular rhythm  Abdomen - soft, nontender  Extremities:  No swelling or varicosities noted  Pelvic - VULVA: normal appearing  vulva with no masses, tenderness or lesions  VAGINA: normal appearing vagina with normal color and discharge, no lesions  CERVIX: normal appearing cervix without discharge or lesions, no CMT  Thin prep pap is done with HR HPV cotesting  Chaperone: Faith Rogue    TODAY'S NT [place] 12 wks,measurements c/w dates,fundal placenta,normal ovaries,FHR 158 bpm,NB present,NT 1.3 mm,CRL 56.62 mm   No results found for this or any previous visit (from the past 24 hours).  Assessment & Plan:  1) Low-Risk Pregnancy G1P0000 at [redacted]w[redacted]d with an Estimated Date of Delivery: 07/24/24   2) Initial OB visit  3) Nullip/BMI 30.3, rx bASA 162mg /day  4) Recent trich tx 1/21, TOC today  5) Rh neg, had first trimester Rhogam; plan again for 28-30wks  Meds:  Meds ordered this encounter  Medications   Blood Pressure Monitor MISC    Sig: For regular home bp monitoring during pregnancy    Dispense:  1 each    Refill:  0    Z34.81 Please mail to patient   aspirin 81 MG chewable tablet    Sig: Chew 2  tablets (162 mg total) by mouth daily.    Dispense:  60 tablet    Refill:  7    Supervising Provider:   Myna Hidalgo [[id number]]    Initial labs obtained Continue prenatal vitamins Reviewed n/v relief measures and warning s/s to report Reviewed recommended weight gain based on pre-gravid BMI Encouraged well-balanced diet Genetic & carrier screening discussed: requests Panorama and NT/IT, requests Horizon  Ultrasound discussed; fetal survey: requested [facility] completed> form faxed if has or is planning to apply for medicaid The nature of [place] [facility] [employer] with multiple MDs and other Advanced Practice Providers was explained to patient; also emphasized that fellows, residents, and students are part of our team. Does not have home bp cuff. Office bp cuff given: no. Rx sent: yes. Check bp weekly, let us know if consistently >140/90.   Indications for ASA therapy (per uptodate) OR Two or more of the following: Nulliparity Yes Obesity (BMI>30 kg/m2) Yes   Follow-up: Return for 4wk LROB & 2nd IT; 8wk LROB & anatomy u/s.   Orders Placed This Encounter  Procedures   Urine Culture   US OB Comp + 14 Wk   Integrated 1   HORIZON CUSTOM   PANORAMA PRENATAL TEST   Hemoglobin A1c   CBC/D/Plt+RPR+Rh+ABO+RubIgG...    Arabella Merles CNM 01/10/2024 11:12 AM

## 2024-01-10 NOTE — Progress Notes (Signed)
[place] 12 wks,measurements c/w dates,fundal placenta,normal ovaries,FHR 158 bpm,NB present,NT 1.3 mm,CRL 56.62 mm

## 2024-01-12 LAB — CYTOLOGY - PAP
Comment: NEGATIVE
Comment: NEGATIVE
Diagnosis: NEGATIVE
High risk HPV: NEGATIVE
Trichomonas: NEGATIVE

## 2024-01-13 LAB — URINE CULTURE

## 2024-01-17 ENCOUNTER — Encounter: Payer: Self-pay | Admitting: Advanced Practice Midwife

## 2024-01-17 ENCOUNTER — Other Ambulatory Visit: Payer: Self-pay | Admitting: Advanced Practice Midwife

## 2024-01-17 DIAGNOSIS — R8271 Bacteriuria: Secondary | ICD-10-CM

## 2024-01-17 DIAGNOSIS — Z3401 Encounter for supervision of normal first pregnancy, first trimester: Secondary | ICD-10-CM

## 2024-01-17 MED ORDER — CEPHALEXIN 500 MG PO CAPS: 500.0000 mg | ORAL_CAPSULE | Freq: Two times a day (BID) | ORAL | 0 refills | Status: AC

## 2024-01-19 ENCOUNTER — Encounter: Payer: Self-pay | Admitting: Advanced Practice Midwife

## 2024-01-19 LAB — PANORAMA PRENATAL TEST FULL PANEL:PANORAMA TEST PLUS 5 ADDITIONAL MICRODELETIONS: FETAL FRACTION: 7.2

## 2024-01-24 ENCOUNTER — Encounter: Payer: Self-pay | Admitting: Advanced Practice Midwife

## 2024-01-24 LAB — HORIZON CUSTOM: REPORT SUMMARY: NEGATIVE

## 2024-02-07 ENCOUNTER — Encounter: Payer: Self-pay | Admitting: Women's Health

## 2024-02-07 ENCOUNTER — Ambulatory Visit: Payer: Medicaid Other | Admitting: Women's Health

## 2024-02-07 ENCOUNTER — Other Ambulatory Visit (HOSPITAL_COMMUNITY)
Admission: RE | Admit: 2024-02-07 | Discharge: 2024-02-07 | Disposition: A | Discharge status: Home / Self Care | Source: Ambulatory Visit | Attending: Women's Health | Admitting: Women's Health

## 2024-02-07 VITALS — BP 111/72 | HR 78 | Wt 166.0 lb

## 2024-02-07 DIAGNOSIS — Z3401 Encounter for supervision of normal first pregnancy, first trimester: Secondary | ICD-10-CM

## 2024-02-07 DIAGNOSIS — Z5189 Encounter for other specified aftercare: Secondary | ICD-10-CM

## 2024-02-07 DIAGNOSIS — Z3A16 16 weeks gestation of pregnancy: Secondary | ICD-10-CM

## 2024-02-07 DIAGNOSIS — Z3402 Encounter for supervision of normal first pregnancy, second trimester: Secondary | ICD-10-CM | POA: Insufficient documentation

## 2024-02-07 DIAGNOSIS — L292 Pruritus vulvae: Secondary | ICD-10-CM

## 2024-02-07 DIAGNOSIS — R8271 Bacteriuria: Secondary | ICD-10-CM

## 2024-02-07 NOTE — Patient Instructions (Signed)
 Fredric Mare, thank you for choosing our office today! We appreciate the opportunity to meet your healthcare needs. You may receive a short survey by mail, e-mail, or through [employer]. If you are happy with your care we would appreciate if you could take just a few minutes to complete the survey questions. We read all of your comments and take your feedback very seriously. Thank you again for choosing our office.  Center for [employer] Team at [facility] at [facility] ([address] [place], [place] [address]) Entrance C, located off of [address] Free 24/7 valet parking  Go to [email or web address] to register for FREE online childbirth classes  Call the office [phone]) or go to [facility] if: You begin to severe cramping Your water breaks.  Sometimes it is a big gush of fluid, sometimes it is just a trickle that keeps getting your panties wet or running down your legs You have vaginal bleeding.  It is normal to have a small amount of spotting if your cervix was checked.   [facility] Pediatricians/Family Doctors [facility]): [address] Colette Ribas, [phone]           [facility]: [address], [phone]                [facility]): [address], [phone] (call to ask if accepting patients) [facility] Department: [address], [place], [phone]    [facility] Pediatricians/Family Doctors [facility]): [phone] [address], [phone] [facility]: [address] [place], [phone] [facility] [place]: [address], [phone]  [facility] Doctors  [facility]): [phone] [facility]: [address], [phone]   [facility] Doctors [facility]: [address], [phone]  [facility] Doctors  [facility]: [phone], [phone]  Home Blood Pressure Monitoring for Patients   Your provider has recommended that you check your blood pressure (BP) at least once a week at home. If you do not have a blood pressure cuff at home, one will be provided for you. Contact your provider if you have not received your monitor within 1 week.   Helpful Tips for Accurate Home Blood Pressure Checks  Don't smoke, exercise, or drink caffeine 30 minutes before checking your BP Use the restroom before checking your BP (a full bladder can raise your pressure) Relax in a comfortable upright chair Feet on the ground Left arm resting comfortably on a flat surface at the level of your heart Legs uncrossed Back supported Sit quietly and don't talk Place the cuff on your bare arm Adjust snuggly, so that only two fingertips can fit between your skin and the top of the cuff Check 2 readings separated by at least one minute Keep a log of your BP readings For a visual, please reference this diagram: http://ccnc.care/bpdiagram  Provider Name: [facility]     Phone: [phone]  Zone 1: ALL CLEAR  Continue to monitor your symptoms:  BP reading is less than 140 (top number) or less than 90 (bottom number)  No right upper stomach pain No headaches or seeing spots No feeling nauseated or throwing up No swelling in face and hands  Zone 2: CAUTION Call your doctor's office for any of the following:  BP reading is greater than 140 (top number) or greater than  90 (bottom number)  Stomach pain under your ribs in the middle or right side Headaches or seeing spots Feeling nauseated or throwing up Swelling in face and hands  Zone 3: EMERGENCY  Seek immediate medical care if you have any of the following:  BP reading is greater than160 (top number) or greater than 110 (bottom number) Severe headaches not improving with Tylenol Serious difficulty catching your breath Any worsening symptoms from  Zone 2     Second Trimester of Pregnancy The second trimester is from week 14 through week 27 (months 4 through 6). The second trimester is often a time when you feel your best. Your body has adjusted to being pregnant, and you begin to feel better physically. Usually, morning sickness has lessened or quit completely, you may have more energy, and you may have an increase in appetite. The second trimester is also a time when the fetus is growing rapidly. At the end of the sixth month, the fetus is about 9 inches long and weighs about 1 pounds. You will likely begin to feel the baby move (quickening) between 16 and 20 weeks of pregnancy. Body changes during your second trimester Your body continues to go through many changes during your second trimester. The changes vary from woman to woman. Your weight will continue to increase. You will notice your lower abdomen bulging out. You may begin to get stretch marks on your hips, abdomen, and breasts. You may develop headaches that can be relieved by medicines. The medicines should be approved by your health care provider. You may urinate more often because the fetus is pressing on your bladder. You may develop or continue to have heartburn as a result of your pregnancy. You may develop constipation because certain hormones are causing the muscles that push waste through your intestines to slow down. You may develop hemorrhoids or swollen, bulging veins (varicose veins). You may have back pain. This is caused by: Weight gain. Pregnancy hormones that are relaxing the joints in your pelvis. A shift in weight and the muscles that support your balance. Your breasts will continue to grow and they will continue to become tender. Your gums may bleed and may be sensitive to brushing and flossing. Dark spots or blotches (chloasma, mask of pregnancy) may develop on your face. This will likely fade after the baby is born. A dark line from your belly button to  the pubic area (linea nigra) may appear. This will likely fade after the baby is born. You may have changes in your hair. These can include thickening of your hair, rapid growth, and changes in texture. Some women also have hair loss during or after pregnancy, or hair that feels dry or thin. Your hair will most likely return to normal after your baby is born.  What to expect at prenatal visits During a routine prenatal visit: You will be weighed to make sure you and the fetus are growing normally. Your blood pressure will be taken. Your abdomen will be measured to track your baby's growth. The fetal heartbeat will be listened to. Any test results from the previous visit will be discussed.  Your health care provider may ask you: How you are feeling. If you are feeling the baby move. If you have had any abnormal symptoms, such as leaking fluid, bleeding, severe headaches, or abdominal cramping. If you are using any tobacco products, including cigarettes, chewing tobacco, and electronic cigarettes. If you have any questions.  Other tests that may be performed during  your second trimester include: Blood tests that check for: Low iron levels (anemia). High blood sugar that affects pregnant women (gestational diabetes) between 78 and 28 weeks. Rh antibodies. This is to check for a protein on red blood cells (Rh factor). Urine tests to check for infections, diabetes, or protein in the urine. An ultrasound to confirm the proper growth and development of the baby. An amniocentesis to check for possible genetic problems. Fetal screens for spina bifida and Down syndrome. HIV (human immunodeficiency virus) testing. Routine prenatal testing includes screening for HIV, unless you choose not to have this test.  Follow these instructions at home: Medicines Follow your health care provider's instructions regarding medicine use. Specific medicines may be either safe or unsafe to take during  pregnancy. Take a prenatal vitamin that contains at least 600 micrograms (mcg) of folic acid. If you develop constipation, try taking a stool softener if your health care provider approves. Eating and drinking Eat a balanced diet that includes fresh fruits and vegetables, whole grains, good sources of protein such as meat, eggs, or tofu, and low-fat dairy. Your health care provider will help you determine the amount of weight gain that is right for you. Avoid raw meat and uncooked cheese. These carry germs that can cause birth defects in the baby. If you have low calcium intake from food, talk to your health care provider about whether you should take a daily calcium supplement. Limit foods that are high in fat and processed sugars, such as fried and sweet foods. To prevent constipation: Drink enough fluid to keep your urine clear or pale yellow. Eat foods that are high in fiber, such as fresh fruits and vegetables, whole grains, and beans. Activity Exercise only as directed by your health care provider. Most women can continue their usual exercise routine during pregnancy. Try to exercise for 30 minutes at least 5 days a week. Stop exercising if you experience uterine contractions. Avoid heavy lifting, wear low heel shoes, and practice good posture. A sexual relationship may be continued unless your health care provider directs you otherwise. Relieving pain and discomfort Wear a good support bra to prevent discomfort from breast tenderness. Take warm sitz baths to soothe any pain or discomfort caused by hemorrhoids. Use hemorrhoid cream if your health care provider approves. Rest with your legs elevated if you have leg cramps or low back pain. If you develop varicose veins, wear support hose. Elevate your feet for 15 minutes, 3-4 times a day. Limit salt in your diet. Prenatal Care Write down your questions. Take them to your prenatal visits. Keep all your prenatal visits as told by your health  care provider. This is important. Safety Wear your seat belt at all times when driving. Make a list of emergency phone numbers, including numbers for family, friends, the hospital, and police and fire departments. General instructions Ask your health care provider for a referral to a local prenatal education class. Begin classes no later than the beginning of month 6 of your pregnancy. Ask for help if you have counseling or nutritional needs during pregnancy. Your health care provider can offer advice or refer you to specialists for help with various needs. Do not use hot tubs, steam rooms, or saunas. Do not douche or use tampons or scented sanitary pads. Do not cross your legs for long periods of time. Avoid cat litter boxes and soil used by cats. These carry germs that can cause birth defects in the baby and possibly loss of the  fetus by miscarriage or stillbirth. Avoid all smoking, herbs, alcohol, and unprescribed drugs. Chemicals in these products can affect the formation and growth of the baby. Do not use any products that contain nicotine or tobacco, such as cigarettes and e-cigarettes. If you need help quitting, ask your health care provider. Visit your dentist if you have not gone yet during your pregnancy. Use a soft toothbrush to brush your teeth and be gentle when you floss. Contact a health care provider if: You have dizziness. You have mild pelvic cramps, pelvic pressure, or nagging pain in the abdominal area. You have persistent nausea, vomiting, or diarrhea. You have a bad smelling vaginal discharge. You have pain when you urinate. Get help right away if: You have a fever. You are leaking fluid from your vagina. You have spotting or bleeding from your vagina. You have severe abdominal cramping or pain. You have rapid weight gain or weight loss. You have shortness of breath with chest pain. You notice sudden or extreme swelling of your face, hands, ankles, feet, or legs. You  have not felt your baby move in over an hour. You have severe headaches that do not go away when you take medicine. You have vision changes. Summary The second trimester is from week 14 through week 27 (months 4 through 6). It is also a time when the fetus is growing rapidly. Your body goes through many changes during pregnancy. The changes vary from woman to woman. Avoid all smoking, herbs, alcohol, and unprescribed drugs. These chemicals affect the formation and growth your baby. Do not use any tobacco products, such as cigarettes, chewing tobacco, and e-cigarettes. If you need help quitting, ask your health care provider. Contact your health care provider if you have any questions. Keep all prenatal visits as told by your health care provider. This is important. This information is not intended to replace advice given to you by your health care provider. Make sure you discuss any questions you have with your health care provider. Document Released: 11/01/2001 Document Revised: 04/14/2016 Document Reviewed: 01/08/2013 Elsevier Interactive Patient Education  2017 [employer].

## 2024-02-07 NOTE — Progress Notes (Signed)
 LOW-RISK PREGNANCY VISIT Patient name: Pamela [name] MRN [id number]  Date of birth: 05-Feb-1996 Chief Complaint:   Routine Prenatal Visit (Think has yeast infection)  History of Present Illness:   Pamela [name] is a 28 y.o. G1P0000 female at [redacted]w[redacted]d with an Estimated Date of Delivery: 07/24/24 being seen today for ongoing management of a low-risk pregnancy.   Today she reports  vulvar itching/burning, thinks may have yeast infection . Contractions: Not present.  .  Movement: Absent. denies leaking of fluid.     01/10/2024   10:27 AM 07/02/2020    2:27 PM 07/24/2019    3:08 PM 06/28/2019    3:11 PM 12/27/2018   10:43 AM  Depression screen PHQ 2/9  Decreased Interest 0 2 0 2 0  Down, Depressed, Hopeless 0 1 0 2 0  PHQ - 2 Score 0 3 0 4 0  Altered sleeping 0 3 1 2  0  Tired, decreased energy 1 2 1 2  0  Change in appetite 0 3 0 0 0  Feeling bad or failure about yourself  0 1 0 1 0  Trouble concentrating 0 2 0 1 0  Moving slowly or fidgety/restless 0 0 0 0 0  Suicidal thoughts 0 0 0 0 0  PHQ-9 Score 1 14 2 10  0  Difficult doing work/chores   Not difficult at all          01/10/2024   10:27 AM 07/02/2020    2:29 PM 07/24/2019    3:11 PM 06/28/2019    3:11 PM  GAD 7 : Generalized Anxiety Score  Nervous, Anxious, on Edge 0 1 2 2   Control/stop worrying 0 1 0 1  Worry too much - different things 0 1 0 2  Trouble relaxing 0 1 0 3  Restless 0 0 0 0  Easily annoyed or irritable 0 1 0 2  Afraid - awful might happen 0 0 0 1  Total GAD 7 Score 0 5 2 11   Anxiety Difficulty   Not difficult at all       Review of Systems:   Pertinent items are noted in HPI Denies abnormal vaginal discharge w/ itching/odor/irritation, headaches, visual changes, shortness of breath, chest pain, abdominal pain, severe nausea/vomiting, or problems with urination or bowel movements unless otherwise stated above. Pertinent History Reviewed:  Reviewed past medical,surgical, social, obstetrical and family history.   Reviewed problem list, medications and allergies. Physical Assessment:   Vitals:   02/07/24 0953  BP: 111/72  Pulse: 78  Weight: 166 lb (75.3 kg)  Body mass index is 29.41 kg/m.        Physical Examination:   General appearance: Well appearing, and in no distress  Mental status: Alert, oriented to person, place, and time  Skin: Warm & dry  Cardiovascular: Normal heart rate noted  Respiratory: Normal respiratory effort, no distress  Abdomen: Soft, gravid, nontender  Pelvic:  spec exam: small amt white nonodorous d/c, CV swab obtained          Extremities: Edema: None  Fetal Status: Fetal Heart Rate (bpm): 152   Movement: Absent    Chaperone: Peggy Dones No results found for this or any previous visit (from the past 24 hours).  Assessment & Plan:  1) Low-risk pregnancy G1P0000 at [redacted]w[redacted]d with an Estimated Date of Delivery: 07/24/24   2) Vulvar itching/burning, CV swab  3) Recent ASB> urine cx poc today   Meds: No orders of the defined types were placed in this encounter.  Labs/procedures today:  did not have pn1/1st IT/A1C drawn at new ob, had balance w/ [facility]. Wants AFP- orders entered for [facility]  Plan:  Continue routine obstetrical care  Next visit: prefers will be in person for u/s     Reviewed: Preterm labor symptoms and general obstetric precautions including but not limited to vaginal bleeding, contractions, leaking of fluid and fetal movement were reviewed in detail with the patient.  All questions were answered. Does have home bp cuff. Office bp cuff given: not applicable. Check bp weekly, let us know if consistently >140 and/or >90.  Follow-up: Return for As scheduled.  Future Appointments  Date Time Provider Department Center  03/06/2024 10:00 AM [facility] [place] [facility] None  03/06/2024 10:50 AM Sue Lush, FNP [facility]    Orders Placed This Encounter  Procedures   Urine Culture   AFP, Quad Screen   Cheral Marker CNM,  [place] 02/07/2024 10:44 AM

## 2024-02-08 ENCOUNTER — Encounter: Payer: Self-pay | Admitting: Women's Health

## 2024-02-08 LAB — CERVICOVAGINAL ANCILLARY ONLY
Bacterial Vaginitis (gardnerella): NEGATIVE
Candida Glabrata: NEGATIVE
Candida Vaginitis: POSITIVE — AB
Chlamydia: NEGATIVE
Comment: NEGATIVE
Comment: NEGATIVE
Comment: NEGATIVE
Comment: NEGATIVE
Comment: NEGATIVE
Comment: NORMAL
Neisseria Gonorrhea: NEGATIVE
Trichomonas: NEGATIVE

## 2024-02-09 LAB — AFP, QUAD SCREEN
AFP: 20.1 ng/mL
Age Alone: 1
Curr Gest Age: 16 wk
Down Syndrome Scr Risk Est: <1
HCG, Total: 16.08 [IU]/mL
INH: 206 pg/mL
Maternal Wt: 166 [lb_av]
MoM for AFP: 0.64
MoM for INH: 1.41
MoM for hCG: 0.49
Osb Risk: <1
Trisomy 18 (Edward) Syndrome Interp.: <1
Twins-AFP: 1
uE3 Mom: 1.05
uE3 Value: 0.96 ng/mL

## 2024-02-12 ENCOUNTER — Encounter: Payer: Self-pay | Admitting: Women's Health

## 2024-02-12 LAB — URINE CULTURE

## 2024-02-12 MED ORDER — NITROFURANTOIN MONOHYD MACRO 100 MG PO CAPS
100.0000 mg | ORAL_CAPSULE | Freq: Two times a day (BID) | ORAL | 0 refills | Status: DC
Start: 2024-02-12 — End: 2024-03-20

## 2024-02-12 NOTE — Addendum Note (Signed)
 Addended by: Cheral Marker on: 02/12/2024 08:36 AM   Modules accepted: Orders

## 2024-03-06 ENCOUNTER — Ambulatory Visit: Payer: Medicaid Other | Admitting: Obstetrics and Gynecology

## 2024-03-06 ENCOUNTER — Encounter: Payer: Self-pay | Admitting: Obstetrics and Gynecology

## 2024-03-06 ENCOUNTER — Ambulatory Visit: Payer: Medicaid Other

## 2024-03-06 VITALS — BP 109/72 | HR 59 | Wt 163.2 lb

## 2024-03-06 DIAGNOSIS — Z3401 Encounter for supervision of normal first pregnancy, first trimester: Secondary | ICD-10-CM

## 2024-03-06 DIAGNOSIS — Z3A20 20 weeks gestation of pregnancy: Secondary | ICD-10-CM

## 2024-03-06 DIAGNOSIS — R8271 Bacteriuria: Secondary | ICD-10-CM

## 2024-03-06 DIAGNOSIS — Z363 Encounter for antenatal screening for malformations: Secondary | ICD-10-CM

## 2024-03-06 DIAGNOSIS — Z3402 Encounter for supervision of normal first pregnancy, second trimester: Secondary | ICD-10-CM

## 2024-03-06 NOTE — Progress Notes (Signed)
 US  20 wks,breech,posterior fundal placenta gr 1,cx 3 cm,SVP of fluid 5.2 cm,EFW 340 g 58%,anatomy complete,no obvious abnormalities

## 2024-03-08 LAB — URINE CULTURE
MICRO NUMBER:: 16338854
SPECIMEN QUALITY:: ADEQUATE

## 2024-03-08 NOTE — Progress Notes (Signed)
 Did not stay for appointment after ultrasound

## 2024-03-11 ENCOUNTER — Other Ambulatory Visit: Payer: Self-pay | Admitting: Obstetrics and Gynecology

## 2024-03-11 MED ORDER — CEFADROXIL 500 MG PO CAPS
500.0000 mg | ORAL_CAPSULE | Freq: Two times a day (BID) | ORAL | 0 refills | Status: DC
Start: 2024-03-11 — End: 2024-03-20

## 2024-03-20 ENCOUNTER — Ambulatory Visit: Admitting: Advanced Practice Midwife

## 2024-03-20 ENCOUNTER — Encounter: Payer: Self-pay | Admitting: Advanced Practice Midwife

## 2024-03-20 VITALS — BP 113/73 | HR 80 | Wt 160.4 lb

## 2024-03-20 DIAGNOSIS — Z3402 Encounter for supervision of normal first pregnancy, second trimester: Secondary | ICD-10-CM | POA: Diagnosis not present

## 2024-03-20 DIAGNOSIS — Z5189 Encounter for other specified aftercare: Secondary | ICD-10-CM

## 2024-03-20 DIAGNOSIS — Z3A22 22 weeks gestation of pregnancy: Secondary | ICD-10-CM

## 2024-03-20 DIAGNOSIS — R8271 Bacteriuria: Secondary | ICD-10-CM | POA: Diagnosis not present

## 2024-03-20 DIAGNOSIS — Z3401 Encounter for supervision of normal first pregnancy, first trimester: Secondary | ICD-10-CM

## 2024-03-20 MED ORDER — ONDANSETRON 4 MG PO TBDP: 4.0000 mg | ORAL_TABLET | Freq: Three times a day (TID) | ORAL | 1 refills | Status: DC | PRN

## 2024-03-20 NOTE — Patient Instructions (Signed)
 Pamela [name], I greatly value your feedback.  If you receive a survey following your visit with us  today, we appreciate you taking the time to fill it out.  Thanks, Pamela [name], CNM   You will have your sugar test next visit.  Please do not eat or drink anything after midnight the night before you come, not even water.  You will be here for at least two hours.  Please make an appointment online for the bloodwork at [email or web address] for 8:30am (or as close to this as possible). Make sure you select the [facility] service center. The day of the appointment, check in with our office first, then you will go to [facility] to start the sugar test.    [facility] HAS MOVED!!! It is now [facility] ([address] [place], [place] [address]) Entrance C, located off of [address] valet parking  Go to [email or web address] to register for FREE online childbirth classes   Call the office [phone]) or go to [facility] if: You begin to have strong, frequent contractions Your water breaks.  Sometimes it is a big gush of fluid, sometimes it is just a trickle that keeps getting your panties wet or running down your legs You have vaginal bleeding.  It is normal to have a small amount of spotting if your cervix was checked.  You don't feel your baby moving like normal.  If you don't, get you something to eat and drink and lay down and focus on feeling your baby move.   If your baby is still not moving like normal, you should call the office or go to [facility].  [place] Pediatricians/Family Doctors: [facility] [phone]           [facility] [phone]                [facility] [phone] (usually not accepting new patients unless you have family there already, you are always welcome to call and ask)      [facility] Department [phone]       [facility] Pediatricians/Family Doctors:  [facility]: [phone] [facility]: [phone] [facility]: [phone]  [facility] Doctors:  [facility]: [phone]  [facility]: [phone]  [facility] Doctors: [facility]: [phone]   Home Blood Pressure Monitoring for Patients   Your provider has recommended that you check your blood pressure (BP) at least once a week at home. If you do not have a blood pressure cuff at home, one will be provided for you. Contact your provider if you have not received your monitor within 1 week.   Helpful Tips for Accurate Home Blood Pressure Checks  Don't smoke, exercise, or drink caffeine 30 minutes before checking your BP Use the restroom before checking your BP (a full bladder can raise your pressure) Relax in a comfortable upright chair Feet on the ground Left arm resting comfortably on a flat surface at the level of your heart Legs uncrossed Back supported Sit quietly and don't talk Place the cuff on your bare arm Adjust snuggly, so that only two fingertips can fit between your skin and the top of the cuff Check 2 readings separated by at least one minute Keep a log of your BP readings For a visual, please reference this diagram: http://ccnc.care/bpdiagram  Provider Name: [facility]     Phone: [phone]  Zone 1: ALL CLEAR  Continue to monitor your symptoms:  BP reading is less than 140 (top number) or less than 90 (bottom number)  No right upper stomach pain No headaches or seeing spots No feeling nauseated or throwing up No swelling in face and hands  Zone 2: CAUTION Call your doctor's office for any of the following:  BP reading is greater than 140 (top number) or greater than 90 (bottom number)  Stomach pain under your ribs in the middle or right side Headaches or seeing spots Feeling nauseated or throwing up Swelling in face and hands  Zone 3: EMERGENCY  Seek  immediate medical care if you have any of the following:  BP reading is greater than160 (top number) or greater than 110 (bottom number) Severe headaches not improving with Tylenol Serious difficulty catching your breath Any worsening symptoms from Zone 2   Second Trimester of Pregnancy The second trimester is from week 13 through week 28, months 4 through 6. The second trimester is often a time when you feel your best. Your body has also adjusted to being pregnant, and you begin to feel better physically. Usually, morning sickness has lessened or quit completely, you may have more energy, and you may have an increase in appetite. The second trimester is also a time when the fetus is growing rapidly. At the end of the sixth month, the fetus is about 9 inches long and weighs about 1 pounds. You will likely begin to feel the baby move (quickening) between 18 and 20 weeks of the pregnancy. BODY CHANGES Your body goes through many changes during pregnancy. The changes vary from woman to woman.  Your weight will continue to increase. You will notice your lower abdomen bulging out. You may begin to get stretch marks on your hips, abdomen, and breasts. You may develop headaches that can be relieved by medicines approved by your health care provider. You may urinate more often because the fetus is pressing on your bladder. You may develop or continue to have heartburn as a result of your pregnancy. You may develop constipation because certain hormones are causing the muscles that push waste through your intestines to slow down. You may develop hemorrhoids or swollen, bulging veins (varicose veins). You may have back pain because of the weight gain and pregnancy hormones relaxing your joints between the bones in your pelvis and as a result of a shift in weight and the muscles that support your balance. Your breasts will continue to grow and be tender. Your gums may bleed and may be sensitive to brushing  and flossing. Dark spots or blotches (chloasma, mask of pregnancy) may develop on your face. This will likely fade after the baby is born. A dark line from your belly button to the pubic area (linea nigra) may appear. This will likely fade after the baby is born. You may have changes in your hair. These can include thickening of your hair, rapid growth, and changes in texture. Some women also have hair loss during or after pregnancy, or hair that feels dry or thin. Your hair will most likely return to normal after your baby is born. WHAT TO EXPECT AT YOUR PRENATAL VISITS During a routine prenatal visit: You will be weighed to make sure you and the fetus are growing normally. Your blood pressure will be taken. Your abdomen will be measured to track your baby's growth. The fetal heartbeat will be listened to. Any test results from the previous visit will be discussed. Your health care provider  may ask you: How you are feeling. If you are feeling the baby move. If you have had any abnormal symptoms, such as leaking fluid, bleeding, severe headaches, or abdominal cramping. If you have any questions. Other tests that may be performed during your second trimester include: Blood tests that check for: Low iron levels (anemia). Gestational diabetes (between 24 and 28 weeks). Rh antibodies. Urine tests to check for infections, diabetes, or protein in the urine. An ultrasound to confirm the proper growth and development of the baby. An amniocentesis to check for possible genetic problems. Fetal screens for spina bifida and Down syndrome. HOME CARE INSTRUCTIONS  Avoid all smoking, herbs, alcohol, and unprescribed drugs. These chemicals affect the formation and growth of the baby. Follow your health care provider's instructions regarding medicine use. There are medicines that are either safe or unsafe to take during pregnancy. Exercise only as directed by your health care provider. Experiencing  uterine cramps is a good sign to stop exercising. Continue to eat regular, healthy meals. Wear a good support bra for breast tenderness. Do not use hot tubs, steam rooms, or saunas. Wear your seat belt at all times when driving. Avoid raw meat, uncooked cheese, cat litter boxes, and soil used by cats. These carry germs that can cause birth defects in the baby. Take your prenatal vitamins. Try taking a stool softener (if your health care provider approves) if you develop constipation. Eat more high-fiber foods, such as fresh vegetables or fruit and whole grains. Drink plenty of fluids to keep your urine clear or pale yellow. Take warm sitz baths to soothe any pain or discomfort caused by hemorrhoids. Use hemorrhoid cream if your health care provider approves. If you develop varicose veins, wear support hose. Elevate your feet for 15 minutes, 3-4 times a day. Limit salt in your diet. Avoid heavy lifting, wear low heel shoes, and practice good posture. Rest with your legs elevated if you have leg cramps or low back pain. Visit your dentist if you have not gone yet during your pregnancy. Use a soft toothbrush to brush your teeth and be gentle when you floss. A sexual relationship may be continued unless your health care provider directs you otherwise. Continue to go to all your prenatal visits as directed by your health care provider. SEEK MEDICAL CARE IF:  You have dizziness. You have mild pelvic cramps, pelvic pressure, or nagging pain in the abdominal area. You have persistent nausea, vomiting, or diarrhea. You have a bad smelling vaginal discharge. You have pain with urination. SEEK IMMEDIATE MEDICAL CARE IF:  You have a fever. You are leaking fluid from your vagina. You have spotting or bleeding from your vagina. You have severe abdominal cramping or pain. You have rapid weight gain or loss. You have shortness of breath with chest pain. You notice sudden or extreme swelling of your face,  hands, ankles, feet, or legs. You have not felt your baby move in over an hour. You have severe headaches that do not go away with medicine. You have vision changes. Document Released: 11/01/2001 Document Revised: 11/12/2013 Document Reviewed: 01/08/2013 [facility] Patient Information 2015 [place], [place]. This information is not intended to replace advice given to you by your health care provider. Make sure you discuss any questions you have with your health care provider.

## 2024-03-20 NOTE — Progress Notes (Signed)
   LOW-RISK PREGNANCY VISIT Patient name: Pamela [name] MRN [id number]  Date of birth: 12-03-1995 Chief Complaint:   Routine Prenatal Visit (Need refill zofran )  History of Present Illness:   Pamela [name] is a 28 y.o. G1P0000 female at [redacted]w[redacted]d with an Estimated Date of Delivery: 07/24/24 being seen today for ongoing management of a low-risk pregnancy.  Today she reports  recent ED visits x 2 for IVF and antiemetic due to N/V; has phenergan  tabs and Diclegis . Contractions: Not present.  .  Movement: Present. denies leaking of fluid. Review of Systems:   Pertinent items are noted in HPI Denies abnormal vaginal discharge w/ itching/odor/irritation, headaches, visual changes, shortness of breath, chest pain, abdominal pain, severe nausea/vomiting, or problems with urination or bowel movements unless otherwise stated above. Pertinent History Reviewed:  Reviewed past medical,surgical, social, obstetrical and family history.  Reviewed problem list, medications and allergies. Physical Assessment:   Vitals:   03/20/24 1047  BP: 113/73  Pulse: 80  Weight: 160 lb 6.4 oz (72.8 kg)  Body mass index is 28.41 kg/m.        Physical Examination:   General appearance: Well appearing, and in no distress  Mental status: Alert, oriented to person, place, and time  Skin: Warm & dry  Cardiovascular: Normal heart rate noted  Respiratory: Normal respiratory effort, no distress  Abdomen: Soft, gravid, nontender  Pelvic: Cervical exam deferred         Extremities: Edema: None  Fetal Status: Fetal Heart Rate (bpm): 137   Movement: Present    (Anatomy u/s from 4/19): US  20 wks,breech,posterior fundal placenta gr 1,cx 3 cm,SVP of fluid 5.2 cm,EFW 340 g 58%,anatomy complete,no obvious abnormalities   No results found for this or any previous visit (from the past 24 hours).  Assessment & Plan:  1) Low-risk pregnancy G1P0000 at [redacted]w[redacted]d with an Estimated Date of Delivery: 07/24/24   2) E coli UTI x 2, completed  both rounds of abx; will send TOC today and if positive still will give Rocephin and suppression  3) N/V of preg, rx Zofran  ODT; states has Phenergan  but it makes her so sleepy  4) Need PN1 labs, has balance at [facility] and these weren't collected at [facility] last month; will do PN1 and GTT at [facility] next month (discussed w Tish)    Meds:  Meds ordered this encounter  Medications   ondansetron  (ZOFRAN -ODT) 4 MG disintegrating tablet    Sig: Take 1 tablet (4 mg total) by mouth every 8 (eight) hours as needed for nausea or vomiting.    Dispense:  30 tablet    Refill:  1    Supervising Provider:   Keene Pastures [[id number]]   Labs/procedures today: urine culture TOC  Plan:  Continue routine obstetrical care   Reviewed: Preterm labor symptoms and general obstetric precautions including but not limited to vaginal bleeding, contractions, leaking of fluid and fetal movement were reviewed in detail with the patient.  All questions were answered. Didn't ask about home bp cuff. Check bp weekly, let us  know if >140/90.   Follow-up: Return in about 4 weeks (around 04/17/2024) for LROB, PN2 (also needs PN1; will [facility] all of these at [facility]).  Orders Placed This Encounter  Procedures   Urine Culture   Jolayne Natter [facility] 03/20/2024 11:47 AM

## 2024-04-03 ENCOUNTER — Encounter: Admitting: Advanced Practice Midwife

## 2024-04-17 ENCOUNTER — Encounter: Payer: Self-pay | Admitting: Advanced Practice Midwife

## 2024-04-17 ENCOUNTER — Ambulatory Visit: Admitting: Advanced Practice Midwife

## 2024-04-17 VITALS — BP 131/73 | HR 84 | Wt 169.0 lb

## 2024-04-17 DIAGNOSIS — Z3401 Encounter for supervision of normal first pregnancy, first trimester: Secondary | ICD-10-CM

## 2024-04-17 DIAGNOSIS — Z3A26 26 weeks gestation of pregnancy: Secondary | ICD-10-CM

## 2024-04-17 DIAGNOSIS — Z3402 Encounter for supervision of normal first pregnancy, second trimester: Secondary | ICD-10-CM

## 2024-04-17 NOTE — Patient Instructions (Signed)
 Pamela [name], thank you for choosing our office today! We appreciate the opportunity to meet your healthcare needs. You may receive a short survey by mail, e-mail, or through [employer]. If you are happy with your care we would appreciate if you could take just a few minutes to complete the survey questions. We read all of your comments and take your feedback very seriously. Thank you again for choosing our office.  Center for [employer] Team at [facility] at [facility] ([address] [place], [place] [address]) Entrance C, located off of [address] Free 24/7 valet parking  Go to [email or web address] to register for FREE online childbirth classes  Call the office [phone]) or go to [facility] if: You begin to severe cramping Your water breaks.  Sometimes it is a big gush of fluid, sometimes it is just a trickle that keeps getting your panties wet or running down your legs You have vaginal bleeding.  It is normal to have a small amount of spotting if your cervix was checked.   [facility] Pediatricians/Family Doctors [facility]): [address] Colette Ribas, [phone]           [facility]: [address], [phone]                [facility]): [address], [phone] (call to ask if accepting patients) [facility] Department: [address], [place], [phone]    [facility] Pediatricians/Family Doctors [facility]): [phone] [address], [phone] [facility]: [address] [place], [phone] [facility] [place]: [address], [phone]  [facility] Doctors  [facility]): [phone] [facility]: [address], [phone]   [facility] Doctors [facility]: [address], [phone]  [facility] Doctors  [facility]: [phone], [phone]  Home Blood Pressure Monitoring for Patients   Your provider has recommended that you check your blood pressure (BP) at least once a week at home. If you do not have a blood pressure cuff at home, one will be provided for you. Contact your provider if you have not received your monitor within 1 week.   Helpful Tips for Accurate Home Blood Pressure Checks  Don't smoke, exercise, or drink caffeine 30 minutes before checking your BP Use the restroom before checking your BP (a full bladder can raise your pressure) Relax in a comfortable upright chair Feet on the ground Left arm resting comfortably on a flat surface at the level of your heart Legs uncrossed Back supported Sit quietly and don't talk Place the cuff on your bare arm Adjust snuggly, so that only two fingertips can fit between your skin and the top of the cuff Check 2 readings separated by at least one minute Keep a log of your BP readings For a visual, please reference this diagram: http://ccnc.care/bpdiagram  Provider Name: [facility]     Phone: [phone]  Zone 1: ALL CLEAR  Continue to monitor your symptoms:  BP reading is less than 140 (top number) or less than 90 (bottom number)  No right upper stomach pain No headaches or seeing spots No feeling nauseated or throwing up No swelling in face and hands  Zone 2: CAUTION Call your doctor's office for any of the following:  BP reading is greater than 140 (top number) or greater than  90 (bottom number)  Stomach pain under your ribs in the middle or right side Headaches or seeing spots Feeling nauseated or throwing up Swelling in face and hands  Zone 3: EMERGENCY  Seek immediate medical care if you have any of the following:  BP reading is greater than160 (top number) or greater than 110 (bottom number) Severe headaches not improving with Tylenol Serious difficulty catching your breath Any worsening symptoms from  Zone 2     Second Trimester of Pregnancy The second trimester is from week 14 through week 27 (months 4 through 6). The second trimester is often a time when you feel your best. Your body has adjusted to being pregnant, and you begin to feel better physically. Usually, morning sickness has lessened or quit completely, you may have more energy, and you may have an increase in appetite. The second trimester is also a time when the fetus is growing rapidly. At the end of the sixth month, the fetus is about 9 inches long and weighs about 1 pounds. You will likely begin to feel the baby move (quickening) between 16 and 20 weeks of pregnancy. Body changes during your second trimester Your body continues to go through many changes during your second trimester. The changes vary from woman to woman. Your weight will continue to increase. You will notice your lower abdomen bulging out. You may begin to get stretch marks on your hips, abdomen, and breasts. You may develop headaches that can be relieved by medicines. The medicines should be approved by your health care provider. You may urinate more often because the fetus is pressing on your bladder. You may develop or continue to have heartburn as a result of your pregnancy. You may develop constipation because certain hormones are causing the muscles that push waste through your intestines to slow down. You may develop hemorrhoids or swollen, bulging veins (varicose veins). You may have back pain. This is caused by: Weight gain. Pregnancy hormones that are relaxing the joints in your pelvis. A shift in weight and the muscles that support your balance. Your breasts will continue to grow and they will continue to become tender. Your gums may bleed and may be sensitive to brushing and flossing. Dark spots or blotches (chloasma, mask of pregnancy) may develop on your face. This will likely fade after the baby is born. A dark line from your belly button to  the pubic area (linea nigra) may appear. This will likely fade after the baby is born. You may have changes in your hair. These can include thickening of your hair, rapid growth, and changes in texture. Some women also have hair loss during or after pregnancy, or hair that feels dry or thin. Your hair will most likely return to normal after your baby is born.  What to expect at prenatal visits During a routine prenatal visit: You will be weighed to make sure you and the fetus are growing normally. Your blood pressure will be taken. Your abdomen will be measured to track your baby's growth. The fetal heartbeat will be listened to. Any test results from the previous visit will be discussed.  Your health care provider may ask you: How you are feeling. If you are feeling the baby move. If you have had any abnormal symptoms, such as leaking fluid, bleeding, severe headaches, or abdominal cramping. If you are using any tobacco products, including cigarettes, chewing tobacco, and electronic cigarettes. If you have any questions.  Other tests that may be performed during  your second trimester include: Blood tests that check for: Low iron levels (anemia). High blood sugar that affects pregnant women (gestational diabetes) between 78 and 28 weeks. Rh antibodies. This is to check for a protein on red blood cells (Rh factor). Urine tests to check for infections, diabetes, or protein in the urine. An ultrasound to confirm the proper growth and development of the baby. An amniocentesis to check for possible genetic problems. Fetal screens for spina bifida and Down syndrome. HIV (human immunodeficiency virus) testing. Routine prenatal testing includes screening for HIV, unless you choose not to have this test.  Follow these instructions at home: Medicines Follow your health care provider's instructions regarding medicine use. Specific medicines may be either safe or unsafe to take during  pregnancy. Take a prenatal vitamin that contains at least 600 micrograms (mcg) of folic acid. If you develop constipation, try taking a stool softener if your health care provider approves. Eating and drinking Eat a balanced diet that includes fresh fruits and vegetables, whole grains, good sources of protein such as meat, eggs, or tofu, and low-fat dairy. Your health care provider will help you determine the amount of weight gain that is right for you. Avoid raw meat and uncooked cheese. These carry germs that can cause birth defects in the baby. If you have low calcium intake from food, talk to your health care provider about whether you should take a daily calcium supplement. Limit foods that are high in fat and processed sugars, such as fried and sweet foods. To prevent constipation: Drink enough fluid to keep your urine clear or pale yellow. Eat foods that are high in fiber, such as fresh fruits and vegetables, whole grains, and beans. Activity Exercise only as directed by your health care provider. Most women can continue their usual exercise routine during pregnancy. Try to exercise for 30 minutes at least 5 days a week. Stop exercising if you experience uterine contractions. Avoid heavy lifting, wear low heel shoes, and practice good posture. A sexual relationship may be continued unless your health care provider directs you otherwise. Relieving pain and discomfort Wear a good support bra to prevent discomfort from breast tenderness. Take warm sitz baths to soothe any pain or discomfort caused by hemorrhoids. Use hemorrhoid cream if your health care provider approves. Rest with your legs elevated if you have leg cramps or low back pain. If you develop varicose veins, wear support hose. Elevate your feet for 15 minutes, 3-4 times a day. Limit salt in your diet. Prenatal Care Write down your questions. Take them to your prenatal visits. Keep all your prenatal visits as told by your health  care provider. This is important. Safety Wear your seat belt at all times when driving. Make a list of emergency phone numbers, including numbers for family, friends, the hospital, and police and fire departments. General instructions Ask your health care provider for a referral to a local prenatal education class. Begin classes no later than the beginning of month 6 of your pregnancy. Ask for help if you have counseling or nutritional needs during pregnancy. Your health care provider can offer advice or refer you to specialists for help with various needs. Do not use hot tubs, steam rooms, or saunas. Do not douche or use tampons or scented sanitary pads. Do not cross your legs for long periods of time. Avoid cat litter boxes and soil used by cats. These carry germs that can cause birth defects in the baby and possibly loss of the  fetus by miscarriage or stillbirth. Avoid all smoking, herbs, alcohol, and unprescribed drugs. Chemicals in these products can affect the formation and growth of the baby. Do not use any products that contain nicotine or tobacco, such as cigarettes and e-cigarettes. If you need help quitting, ask your health care provider. Visit your dentist if you have not gone yet during your pregnancy. Use a soft toothbrush to brush your teeth and be gentle when you floss. Contact a health care provider if: You have dizziness. You have mild pelvic cramps, pelvic pressure, or nagging pain in the abdominal area. You have persistent nausea, vomiting, or diarrhea. You have a bad smelling vaginal discharge. You have pain when you urinate. Get help right away if: You have a fever. You are leaking fluid from your vagina. You have spotting or bleeding from your vagina. You have severe abdominal cramping or pain. You have rapid weight gain or weight loss. You have shortness of breath with chest pain. You notice sudden or extreme swelling of your face, hands, ankles, feet, or legs. You  have not felt your baby move in over an hour. You have severe headaches that do not go away when you take medicine. You have vision changes. Summary The second trimester is from week 14 through week 27 (months 4 through 6). It is also a time when the fetus is growing rapidly. Your body goes through many changes during pregnancy. The changes vary from woman to woman. Avoid all smoking, herbs, alcohol, and unprescribed drugs. These chemicals affect the formation and growth your baby. Do not use any tobacco products, such as cigarettes, chewing tobacco, and e-cigarettes. If you need help quitting, ask your health care provider. Contact your health care provider if you have any questions. Keep all prenatal visits as told by your health care provider. This is important. This information is not intended to replace advice given to you by your health care provider. Make sure you discuss any questions you have with your health care provider. Document Released: 11/01/2001 Document Revised: 04/14/2016 Document Reviewed: 01/08/2013 Elsevier Interactive Patient Education  2017 [employer].

## 2024-04-17 NOTE — Progress Notes (Signed)
   LOW-RISK PREGNANCY VISIT Patient name: Pamela [name] MRN [id number]  Date of birth: 1996/07/16 Chief Complaint:   Routine Prenatal Visit  History of Present Illness:   Pamela [name] is a 28 y.o. G1P0000 female at [redacted]w[redacted]d with an Estimated Date of Delivery: 07/24/24 being seen today for ongoing management of a low-risk pregnancy.  Today she reports overall N/V better, but vomiting after drinking glucola yesterday @ [facility]. Contractions: Not present.  .  Movement: Present. denies leaking of fluid. Review of Systems:   Pertinent items are noted in HPI Denies abnormal vaginal discharge w/ itching/odor/irritation, headaches, visual changes, shortness of breath, chest pain, abdominal pain, severe nausea/vomiting, or problems with urination or bowel movements unless otherwise stated above. Pertinent History Reviewed:  Reviewed past medical,surgical, social, obstetrical and family history.  Reviewed problem list, medications and allergies. Physical Assessment:   Vitals:   04/17/24 1014  BP: 131/73  Pulse: 84  Weight: 169 lb (76.7 kg)  Body mass index is 29.94 kg/m.        Physical Examination:   General appearance: Well appearing, and in no distress  Mental status: Alert, oriented to person, place, and time  Skin: Warm & dry  Cardiovascular: Normal heart rate noted  Respiratory: Normal respiratory effort, no distress  Abdomen: Soft, gravid, nontender  Pelvic: Cervical exam deferred         Extremities: Edema: None  Fetal Status: Fetal Heart Rate (bpm): 138 Fundal Height: 26 cm Movement: Present    No results found for this or any previous visit (from the past 24 hours).  Assessment & Plan:  1) Low-risk pregnancy G1P0000 at [redacted]w[redacted]d with an Estimated Date of Delivery: 07/24/24   2) Vomited glucola, getting labs done at [facility] 2/2 [facility] balance; thinks it was due to the lemon flavor, and lab thinks they can get a different flavor in; will reschedule within the next 2 weeks and will  also get PN1 labs and urine culture as well  3) Recent UTIs, has completed meds but no TOC yet (see above); will get culture w [facility]  4) Rh neg, will get Rhogam w next visit   Meds: No orders of the defined types were placed in this encounter.  Labs/procedures today: none  Plan:  Continue routine obstetrical care   Reviewed: Preterm labor symptoms and general obstetric precautions including but not limited to vaginal bleeding, contractions, leaking of fluid and fetal movement were reviewed in detail with the patient.  All questions were answered. Has home bp cuff. Check bp weekly, let us  know if >140/90.   Follow-up: Return in about 4 weeks (around 05/15/2024) for LROB & Rhogam.  No orders of the defined types were placed in this encounter.  Jolayne Natter CNM 04/17/2024 11:03 AM

## 2024-05-15 ENCOUNTER — Encounter: Payer: Self-pay | Admitting: Women's Health

## 2024-05-15 ENCOUNTER — Ambulatory Visit: Admitting: Women's Health

## 2024-05-15 VITALS — BP 115/74 | HR 73 | Wt 166.3 lb

## 2024-05-15 DIAGNOSIS — Z3403 Encounter for supervision of normal first pregnancy, third trimester: Secondary | ICD-10-CM

## 2024-05-15 DIAGNOSIS — Z6791 Unspecified blood type, Rh negative: Secondary | ICD-10-CM

## 2024-05-15 DIAGNOSIS — Z3A30 30 weeks gestation of pregnancy: Secondary | ICD-10-CM

## 2024-05-15 DIAGNOSIS — Z131 Encounter for screening for diabetes mellitus: Secondary | ICD-10-CM

## 2024-05-15 DIAGNOSIS — Z23 Encounter for immunization: Secondary | ICD-10-CM

## 2024-05-15 NOTE — Patient Instructions (Signed)
 Con, thank you for choosing our office today! We appreciate the opportunity to meet your healthcare needs. You may receive a short survey by mail, e-mail, or through [employer]. If you are happy with your care we would appreciate if you could take just a few minutes to complete the survey questions. We read all of your comments and take your feedback very seriously. Thank you again for choosing our office.  Center for [employer] Team at [facility]  [facility] at [facility] ([address] [place], [place] [address]) Entrance C, located off of [address] Free 24/7 valet parking   CLASSES: Go to [email or web address] to register for classes (childbirth, breastfeeding, waterbirth, infant CPR, daddy bootcamp, etc.)  Call the office [phone]) or go to [facility] if: You begin to have strong, frequent contractions Your water breaks.  Sometimes it is a big gush of fluid, sometimes it is just a trickle that keeps getting your panties wet or running down your legs You have vaginal bleeding.  It is normal to have a small amount of spotting if your cervix was checked.  You don't feel your baby moving like normal.  If you don't, get you something to eat and drink and lay down and focus on feeling your baby move.   If your baby is still not moving like normal, you should call the office or go to [facility].  Call the office [phone]) or go to [facility] hospital for these signs of pre-eclampsia: Severe headache that does not go away with Tylenol Visual changes- seeing spots, double, blurred vision Pain under your right breast or upper abdomen that does not go away with Tums or heartburn medicine Nausea and/or vomiting Severe swelling in your hands, feet, and face   Tdap Vaccine It is recommended that you get the Tdap vaccine during the third trimester of EACH pregnancy to help protect your baby from getting pertussis (whooping cough) 27-36 weeks is the BEST time to do  this so that you can pass the protection on to your baby. During pregnancy is better than after pregnancy, but if you are unable to get it during pregnancy it will be offered at the hospital.  You can get this vaccine with us , at the health department, your family doctor, or some local pharmacies Everyone who will be around your baby should also be up-to-date on their vaccines before the baby comes. Adults (who are not pregnant) only need 1 dose of Tdap during adulthood.   [facility] Pediatricians/Family Doctors [facility]): [address] Luba [place]OCK[place], [phone]           [facility]: [address], [phone]                [facility] Medicine [facility]): [address], [phone] (call to ask if accepting patients) [facility] Department: [address], [place], [phone]    [facility] Pediatricians/Family Doctors [facility]): [phone] [address], [phone] [facility]: [address] [place], [phone] [facility]: [address], [phone]  [facility] Doctors  [facility]): [phone] [facility]: [address], [phone]   [facility] Doctors [facility]: [address], [phone]  [facility] Family Doctors  [facility]: [phone], [phone]  Home Blood Pressure Monitoring for Patients   Your provider has recommended that you check your  blood pressure (BP) at least once a week at home. If you do not have a blood pressure cuff at home, one will be provided for you. Contact your provider if you have not received your monitor within 1 week.   Helpful Tips for Accurate Home Blood Pressure Checks  Don't smoke, exercise, or drink caffeine 30 minutes before checking your BP Use the restroom before checking your BP (a full bladder can raise your  pressure) Relax in a comfortable upright chair Feet on the ground Left arm resting comfortably on a flat surface at the level of your heart Legs uncrossed Back supported Sit quietly and don't talk Place the cuff on your bare arm Adjust snuggly, so that only two fingertips can fit between your skin and the top of the cuff Check 2 readings separated by at least one minute Keep a log of your BP readings For a visual, please reference this diagram: http://ccnc.care/bpdiagram  Provider Name: [facility]     Phone: [phone]  Zone 1: ALL CLEAR  Continue to monitor your symptoms:  BP reading is less than 140 (top number) or less than 90 (bottom number)  No right upper stomach pain No headaches or seeing spots No feeling nauseated or throwing up No swelling in face and hands  Zone 2: CAUTION Call your doctor's office for any of the following:  BP reading is greater than 140 (top number) or greater than 90 (bottom number)  Stomach pain under your ribs in the middle or right side Headaches or seeing spots Feeling nauseated or throwing up Swelling in face and hands  Zone 3: EMERGENCY  Seek immediate medical care if you have any of the following:  BP reading is greater than160 (top number) or greater than 110 (bottom number) Severe headaches not improving with Tylenol Serious difficulty catching your breath Any worsening symptoms from Zone 2   Third Trimester of Pregnancy The third trimester is from week 29 through week 42, months 7 through 9. The third trimester is a time when the fetus is growing rapidly. At the end of the ninth month, the fetus is about 20 inches in length and weighs 6-10 pounds.  BODY CHANGES Your body goes through many changes during pregnancy. The changes vary from woman to woman.  Your weight will continue to increase. You can expect to gain 25-35 pounds (11-16 kg) by the end of the pregnancy. You may begin to get stretch marks on your hips, abdomen,  and breasts. You may urinate more often because the fetus is moving lower into your pelvis and pressing on your bladder. You may develop or continue to have heartburn as a result of your pregnancy. You may develop constipation because certain hormones are causing the muscles that push waste through your intestines to slow down. You may develop hemorrhoids or swollen, bulging veins (varicose veins). You may have pelvic pain because of the weight gain and pregnancy hormones relaxing your joints between the bones in your pelvis. Backaches may result from overexertion of the muscles supporting your posture. You may have changes in your hair. These can include thickening of your hair, rapid growth, and changes in texture. Some women also have hair loss during or after pregnancy, or hair that feels dry or thin. Your hair will most likely return to normal after your baby is born. Your breasts will continue to grow and be tender. A yellow discharge may leak from your breasts called colostrum. Your belly button may stick out. You may  feel short of breath because of your expanding uterus. You may notice the fetus dropping, or moving lower in your abdomen. You may have a bloody mucus discharge. This usually occurs a few days to a week before labor begins. Your cervix becomes thin and soft (effaced) near your due date. WHAT TO EXPECT AT YOUR PRENATAL EXAMS  You will have prenatal exams every 2 weeks until week 36. Then, you will have weekly prenatal exams. During a routine prenatal visit: You will be weighed to make sure you and the fetus are growing normally. Your blood pressure is taken. Your abdomen will be measured to track your baby's growth. The fetal heartbeat will be listened to. Any test results from the previous visit will be discussed. You may have a cervical check near your due date to see if you have effaced. At around 36 weeks, your caregiver will check your cervix. At the same time, your  caregiver will also perform a test on the secretions of the vaginal tissue. This test is to determine if a type of bacteria, Group B streptococcus, is present. Your caregiver will explain this further. Your caregiver may ask you: What your birth plan is. How you are feeling. If you are feeling the baby move. If you have had any abnormal symptoms, such as leaking fluid, bleeding, severe headaches, or abdominal cramping. If you have any questions. Other tests or screenings that may be performed during your third trimester include: Blood tests that check for low iron levels (anemia). Fetal testing to check the health, activity level, and growth of the fetus. Testing is done if you have certain medical conditions or if there are problems during the pregnancy. FALSE LABOR You may feel small, irregular contractions that eventually go away. These are called Braxton Hicks contractions, or false labor. Contractions may last for hours, days, or even weeks before true labor sets in. If contractions come at regular intervals, intensify, or become painful, it is best to be seen by your caregiver.  SIGNS OF LABOR  Menstrual-like cramps. Contractions that are 5 minutes apart or less. Contractions that start on the top of the uterus and spread down to the lower abdomen and back. A sense of increased pelvic pressure or back pain. A watery or bloody mucus discharge that comes from the vagina. If you have any of these signs before the 37th week of pregnancy, call your caregiver right away. You need to go to the hospital to get checked immediately. HOME CARE INSTRUCTIONS  Avoid all smoking, herbs, alcohol, and unprescribed drugs. These chemicals affect the formation and growth of the baby. Follow your caregiver's instructions regarding medicine use. There are medicines that are either safe or unsafe to take during pregnancy. Exercise only as directed by your caregiver. Experiencing uterine cramps is a good sign to  stop exercising. Continue to eat regular, healthy meals. Wear a good support bra for breast tenderness. Do not use hot tubs, steam rooms, or saunas. Wear your seat belt at all times when driving. Avoid raw meat, uncooked cheese, cat litter boxes, and soil used by cats. These carry germs that can cause birth defects in the baby. Take your prenatal vitamins. Try taking a stool softener (if your caregiver approves) if you develop constipation. Eat more high-fiber foods, such as fresh vegetables or fruit and whole grains. Drink plenty of fluids to keep your urine clear or pale yellow. Take warm sitz baths to soothe any pain or discomfort caused by hemorrhoids. Use hemorrhoid cream if  your caregiver approves. If you develop varicose veins, wear support hose. Elevate your feet for 15 minutes, 3-4 times a day. Limit salt in your diet. Avoid heavy lifting, wear low heal shoes, and practice good posture. Rest a lot with your legs elevated if you have leg cramps or low back pain. Visit your dentist if you have not gone during your pregnancy. Use a soft toothbrush to brush your teeth and be gentle when you floss. A sexual relationship may be continued unless your caregiver directs you otherwise. Do not travel far distances unless it is absolutely necessary and only with the approval of your caregiver. Take prenatal classes to understand, practice, and ask questions about the labor and delivery. Make a trial run to the hospital. Pack your hospital bag. Prepare the baby's nursery. Continue to go to all your prenatal visits as directed by your caregiver. SEEK MEDICAL CARE IF: You are unsure if you are in labor or if your water has broken. You have dizziness. You have mild pelvic cramps, pelvic pressure, or nagging pain in your abdominal area. You have persistent nausea, vomiting, or diarrhea. You have a bad smelling vaginal discharge. You have pain with urination. SEEK IMMEDIATE MEDICAL CARE IF:  You  have a fever. You are leaking fluid from your vagina. You have spotting or bleeding from your vagina. You have severe abdominal cramping or pain. You have rapid weight loss or gain. You have shortness of breath with chest pain. You notice sudden or extreme swelling of your face, hands, ankles, feet, or legs. You have not felt your baby move in over an hour. You have severe headaches that do not go away with medicine. You have vision changes. Document Released: 11/01/2001 Document Revised: 11/12/2013 Document Reviewed: 01/08/2013 [facility] Patient Information 2015 [place], [place]. This information is not intended to replace advice given to you by your health care provider. Make sure you discuss any questions you have with your health care provider.

## 2024-05-15 NOTE — Progress Notes (Signed)
 LOW-RISK PREGNANCY VISIT Patient name: Pamela [name] MRN [id number]  Date of birth: October 19, 1996 Chief Complaint:   Routine Prenatal Visit (Rhogam today)  History of Present Illness:   Pamela [name] is a 28 y.o. G1P0000 female at [redacted]w[redacted]d with an Estimated Date of Delivery: 07/24/24 being seen today for ongoing management of a low-risk pregnancy.   Today she reports heartburn-otc meds not helping, swelling, Rt hand numb. Hasn't done labs, has bill w/ [facility] and vomited glucola last time w/ [facility]. Has to reschedule.  Contractions: Irregular.  .  Movement: Present. denies leaking of fluid.     01/10/2024   10:27 AM 07/02/2020    2:27 PM 07/24/2019    3:08 PM 06/28/2019    3:11 PM 12/27/2018   10:43 AM  Depression screen PHQ 2/9  Decreased Interest 0 2 0 2 0  Down, Depressed, Hopeless 0 1 0 2 0  PHQ - 2 Score 0 3 0 4 0  Altered sleeping 0 3 1 2  0  Tired, decreased energy 1 2 1 2  0  Change in appetite 0 3 0 0 0  Feeling bad or failure about yourself  0 1 0 1 0  Trouble concentrating 0 2 0 1 0  Moving slowly or fidgety/restless 0 0 0 0 0  Suicidal thoughts 0 0 0 0 0  PHQ-9 Score 1 14 2 10  0  Difficult doing work/chores   Not difficult at all          01/10/2024   10:27 AM 07/02/2020    2:29 PM 07/24/2019    3:11 PM 06/28/2019    3:11 PM  GAD 7 : Generalized Anxiety Score  Nervous, Anxious, on Edge 0 1 2 2   Control/stop worrying 0 1 0 1  Worry too much - different things 0 1 0 2  Trouble relaxing 0 1 0 3  Restless 0 0 0 0  Easily annoyed or irritable 0 1 0 2  Afraid - awful might happen 0 0 0 1  Total GAD 7 Score 0 5 2 11   Anxiety Difficulty   Not difficult at all       Review of Systems:   Pertinent items are noted in HPI Denies abnormal vaginal discharge w/ itching/odor/irritation, headaches, visual changes, shortness of breath, chest pain, abdominal pain, severe nausea/vomiting, or problems with urination or bowel movements unless otherwise stated above. Pertinent History  Reviewed:  Reviewed past medical,surgical, social, obstetrical and family history.  Reviewed problem list, medications and allergies. Physical Assessment:   Vitals:   05/15/24 0913  BP: 115/74  Pulse: 73  Weight: 166 lb 4.8 oz (75.4 kg)  Body mass index is 29.46 kg/m.        Physical Examination:   General appearance: Well appearing, and in no distress  Mental status: Alert, oriented to person, place, and time  Skin: Warm & dry  Cardiovascular: Normal heart rate noted  Respiratory: Normal respiratory effort, no distress  Abdomen: Soft, gravid, nontender  Pelvic: Cervical exam deferred         Extremities: Edema: None  Fetal Status: Fetal Heart Rate (bpm): 146 Fundal Height: 29 cm Movement: Present    Chaperone: N/A No results found for this or any previous visit (from the past 24 hours).  Assessment & Plan:  1) Low-risk pregnancy G1P0000 at [redacted]w[redacted]d with an Estimated Date of Delivery: 07/24/24   2) Heartburn, rx protonix  3) Swelling> bp good, elevate legs when able  4) Rt hand numb> sounds  like CTS, try wrist splint  5) Recent UTI> needs urine cx poc (with [facility])   Meds: No orders of the defined types were placed in this encounter.  Labs/procedures today: Rhogam & tdap, needs pn1/gtt/urine cx w/ [facility] (balance w/ [facility]) Tish to make sure all in and pt to call to schedule asap  Plan:  Continue routine obstetrical care  Next visit: prefers in person    Reviewed: Preterm labor symptoms and general obstetric precautions including but not limited to vaginal bleeding, contractions, leaking of fluid and fetal movement were reviewed in detail with the patient.  All questions were answered. Does have home bp cuff. Office bp cuff given: not applicable. Check bp weekly, let us  know if consistently >140 and/or >90.  Follow-up: Return in about 2 weeks (around 05/29/2024) for LROB, CNM, in person.  No future appointments.  Orders Placed This Encounter  Procedures   Urine Culture    RHO (D) Immune Globulin   Tdap vaccine greater than or equal to 7yo IM   Glucose Tolerance, 2 Hours w/1 Hour   CBC   HIV Antibody (routine testing w rflx)   RPR   Hepatitis B Surface AntiGEN   Hepatitis C antibody   Rubella screen   Antibody screen   Suzen JONELLE Fetters CNM, [facility] 05/15/2024 9:53 AM

## 2024-05-29 ENCOUNTER — Ambulatory Visit: Admitting: Women's Health

## 2024-05-29 ENCOUNTER — Encounter: Payer: Self-pay | Admitting: Women's Health

## 2024-05-29 VITALS — BP 121/78 | HR 79 | Wt 171.8 lb

## 2024-05-29 DIAGNOSIS — Z3A32 32 weeks gestation of pregnancy: Secondary | ICD-10-CM

## 2024-05-29 DIAGNOSIS — Z3403 Encounter for supervision of normal first pregnancy, third trimester: Secondary | ICD-10-CM

## 2024-05-29 LAB — HEPATITIS C ANTIBODY: HCV Ab: NEGATIVE

## 2024-05-29 NOTE — Patient Instructions (Signed)
 Con, thank you for choosing our office today! We appreciate the opportunity to meet your healthcare needs. You may receive a short survey by mail, e-mail, or through [employer]. If you are happy with your care we would appreciate if you could take just a few minutes to complete the survey questions. We read all of your comments and take your feedback very seriously. Thank you again for choosing our office.  Center for [employer] Team at [facility]  [facility] at [facility] ([address] [place], [place] [address]) Entrance C, located off of [address] Free 24/7 valet parking   CLASSES: Go to [email or web address] to register for classes (childbirth, breastfeeding, waterbirth, infant CPR, daddy bootcamp, etc.)  Call the office [phone]) or go to [facility] if: You begin to have strong, frequent contractions Your water breaks.  Sometimes it is a big gush of fluid, sometimes it is just a trickle that keeps getting your panties wet or running down your legs You have vaginal bleeding.  It is normal to have a small amount of spotting if your cervix was checked.  You don't feel your baby moving like normal.  If you don't, get you something to eat and drink and lay down and focus on feeling your baby move.   If your baby is still not moving like normal, you should call the office or go to [facility].  Call the office [phone]) or go to [facility] hospital for these signs of pre-eclampsia: Severe headache that does not go away with Tylenol Visual changes- seeing spots, double, blurred vision Pain under your right breast or upper abdomen that does not go away with Tums or heartburn medicine Nausea and/or vomiting Severe swelling in your hands, feet, and face   Tdap Vaccine It is recommended that you get the Tdap vaccine during the third trimester of EACH pregnancy to help protect your baby from getting pertussis (whooping cough) 27-36 weeks is the BEST time to do  this so that you can pass the protection on to your baby. During pregnancy is better than after pregnancy, but if you are unable to get it during pregnancy it will be offered at the hospital.  You can get this vaccine with us , at the health department, your family doctor, or some local pharmacies Everyone who will be around your baby should also be up-to-date on their vaccines before the baby comes. Adults (who are not pregnant) only need 1 dose of Tdap during adulthood.   [facility] Pediatricians/Family Doctors [facility]): [address] Luba BROCKS, [phone]           [facility]: [address], [phone]                [facility]): [address], [phone] (call to ask if accepting patients) [facility] Department: [address], [place], [phone]    [facility] Pediatricians/Family Doctors [facility] [phone], [phone] [facility]: [address] [place], [phone] [facility] [place]: [address], [phone]  [facility]  [facility]): [phone] [facility]: [address], [phone]   [facility] Doctors [facility]: [address] [phone]  [facility] Family Doctors  [facility]: [phone], [phone]  Home Blood Pressure Monitoring for Patients   Your provider has recommended that you check your  blood pressure (BP) at least once a week at home. If you do not have a blood pressure cuff at home, one will be provided for you. Contact your provider if you have not received your monitor within 1 week.   Helpful Tips for Accurate Home Blood Pressure Checks  Don't smoke, exercise, or drink caffeine 30 minutes before checking your BP Use the restroom before checking your BP (a full bladder can raise your  pressure) Relax in a comfortable upright chair Feet on the ground Left arm resting comfortably on a flat surface at the level of your heart Legs uncrossed Back supported Sit quietly and don't talk Place the cuff on your bare arm Adjust snuggly, so that only two fingertips can fit between your skin and the top of the cuff Check 2 readings separated by at least one minute Keep a log of your BP readings For a visual, please reference this diagram: http://ccnc.care/bpdiagram  Provider Name: [facility]     Phone: [phone]  Zone 1: ALL CLEAR  Continue to monitor your symptoms:  BP reading is less than 140 (top number) or less than 90 (bottom number)  No right upper stomach pain No headaches or seeing spots No feeling nauseated or throwing up No swelling in face and hands  Zone 2: CAUTION Call your doctor's office for any of the following:  BP reading is greater than 140 (top number) or greater than 90 (bottom number)  Stomach pain under your ribs in the middle or right side Headaches or seeing spots Feeling nauseated or throwing up Swelling in face and hands  Zone 3: EMERGENCY  Seek immediate medical care if you have any of the following:  BP reading is greater than160 (top number) or greater than 110 (bottom number) Severe headaches not improving with Tylenol Serious difficulty catching your breath Any worsening symptoms from Zone 2  Preterm Labor and Birth Information  The normal length of a pregnancy is 39-41 weeks. Preterm labor is when labor starts before 37 completed weeks of pregnancy. What are the risk factors for preterm labor? Preterm labor is more likely to occur in women who: Have certain infections during pregnancy such as a bladder infection, sexually transmitted infection, or infection inside the uterus (chorioamnionitis). Have a shorter-than-normal cervix. Have gone into preterm labor before. Have had surgery on their cervix. Are younger than age 66  or older than age 44. Are African American. Are pregnant with twins or multiple babies (multiple gestation). Take street drugs or smoke while pregnant. Do not gain enough weight while pregnant. Became pregnant shortly after having been pregnant. What are the symptoms of preterm labor? Symptoms of preterm labor include: Cramps similar to those that can happen during a menstrual period. The cramps may happen with diarrhea. Pain in the abdomen or lower back. Regular uterine contractions that may feel like tightening of the abdomen. A feeling of increased pressure in the pelvis. Increased watery or bloody mucus discharge from the vagina. Water breaking (ruptured amniotic sac). Why is it important to recognize signs of preterm labor? It is important to recognize signs of preterm labor because babies who are born prematurely may not be fully developed. This can put them at an increased risk for: Long-term (chronic) heart and lung problems. Difficulty immediately after birth with regulating body systems, including blood sugar, body temperature, heart rate, and breathing rate. Bleeding in the brain. Cerebral palsy. Learning difficulties. Death. These risks are highest for babies who are born before 34 weeks  of pregnancy. How is preterm labor treated? Treatment depends on the length of your pregnancy, your condition, and the health of your baby. It may involve: Having a stitch (suture) placed in your cervix to prevent your cervix from opening too early (cerclage). Taking or being given medicines, such as: Hormone medicines. These may be given early in pregnancy to help support the pregnancy. Medicine to stop contractions. Medicines to help mature the baby's lungs. These may be prescribed if the risk of delivery is high. Medicines to prevent your baby from developing cerebral palsy. If the labor happens before 34 weeks of pregnancy, you may need to stay in the hospital. What should I do if I  think I am in preterm labor? If you think that you are going into preterm labor, call your health care provider right away. How can I prevent preterm labor in future pregnancies? To increase your chance of having a full-term pregnancy: Do not use any tobacco products, such as cigarettes, chewing tobacco, and e-cigarettes. If you need help quitting, ask your health care provider. Do not use street drugs or medicines that have not been prescribed to you during your pregnancy. Talk with your health care provider before taking any herbal supplements, even if you have been taking them regularly. Make sure you gain a healthy amount of weight during your pregnancy. Watch for infection. If you think that you might have an infection, get it checked right away. Make sure to tell your health care provider if you have gone into preterm labor before. This information is not intended to replace advice given to you by your health care provider. Make sure you discuss any questions you have with your health care provider. Document Revised: 03/01/2019 Document Reviewed: 03/30/2016 Elsevier Patient Education  2020 [employer].

## 2024-05-29 NOTE — Progress Notes (Signed)
 LOW-RISK PREGNANCY VISIT Patient name: Pamela [name] MRN [id number]  Date of birth: 1996/01/25 Chief Complaint:   Routine Prenatal Visit (Drop urine off and had pn2 done this morning at [facility])  History of Present Illness:   Pamela [name] is a 28 y.o. G1P0000 female at [redacted]w[redacted]d with an Estimated Date of Delivery: 07/24/24 being seen today for ongoing management of a low-risk pregnancy.   Today she reports some swelling, lower back pain, Lt RLP. Contractions: Not present.  .  Movement: Present. denies leaking of fluid.     01/10/2024   10:27 AM 07/02/2020    2:27 PM 07/24/2019    3:08 PM 06/28/2019    3:11 PM 12/27/2018   10:43 AM  Depression screen PHQ 2/9  Decreased Interest 0 2 0 2 0  Down, Depressed, Hopeless 0 1 0 2 0  PHQ - 2 Score 0 3 0 4 0  Altered sleeping 0 3 1 2  0  Tired, decreased energy 1 2 1 2  0  Change in appetite 0 3 0 0 0  Feeling bad or failure about yourself  0 1 0 1 0  Trouble concentrating 0 2 0 1 0  Moving slowly or fidgety/restless 0 0 0 0 0  Suicidal thoughts 0 0 0 0 0  PHQ-9 Score 1 14 2 10  0  Difficult doing work/chores   Not difficult at all          01/10/2024   10:27 AM 07/02/2020    2:29 PM 07/24/2019    3:11 PM 06/28/2019    3:11 PM  GAD 7 : Generalized Anxiety Score  Nervous, Anxious, on Edge 0 1 2 2   Control/stop worrying 0 1 0 1  Worry too much - different things 0 1 0 2  Trouble relaxing 0 1 0 3  Restless 0 0 0 0  Easily annoyed or irritable 0 1 0 2  Afraid - awful might happen 0 0 0 1  Total GAD 7 Score 0 5 2 11   Anxiety Difficulty   Not difficult at all       Review of Systems:   Pertinent items are noted in HPI Denies abnormal vaginal discharge w/ itching/odor/irritation, headaches, visual changes, shortness of breath, chest pain, abdominal pain, severe nausea/vomiting, or problems with urination or bowel movements unless otherwise stated above. Pertinent History Reviewed:  Reviewed past medical,surgical, social, obstetrical and family  history.  Reviewed problem list, medications and allergies. Physical Assessment:   Vitals:   05/29/24 1044  BP: 121/78  Pulse: 79  Weight: 171 lb 12.8 oz (77.9 kg)  Body mass index is 30.43 kg/m.        Physical Examination:   General appearance: Well appearing, and in no distress  Mental status: Alert, oriented to person, place, and time  Skin: Warm & dry  Cardiovascular: Normal heart rate noted  Respiratory: Normal respiratory effort, no distress  Abdomen: Soft, gravid, nontender  Pelvic: Cervical exam deferred         Extremities: Edema: Trace  Fetal Status: Fetal Heart Rate (bpm): 147 Fundal Height: 30 cm Movement: Present    Chaperone: N/A No results found for this or any previous visit (from the past 24 hours).  Assessment & Plan:  1) Low-risk pregnancy G1P0000 at 103w0d with an Estimated Date of Delivery: 07/24/24   2) Recent GBS bacteruria, left urine for POC at [facility] today   Meds: No orders of the defined types were placed in this encounter.  Labs/procedures today:  labs/GTT/urine cx at [facility] this am  Plan:  Continue routine obstetrical care  Next visit: prefers in person    Reviewed: Preterm labor symptoms and general obstetric precautions including but not limited to vaginal bleeding, contractions, leaking of fluid and fetal movement were reviewed in detail with the patient.  All questions were answered. Does have home bp cuff. Office bp cuff given: not applicable. Check bp weekly, let us  know if consistently >140 and/or >90.  Follow-up: Return for As scheduled.  Future Appointments  Date Time Provider Department Center  06/12/2024 10:50 AM Kizzie Suzen SAUNDERS, CNM [facility]  06/26/2024  9:10 AM Loreli Suzen BIRCH, CNM [facility]  07/03/2024  9:10 AM Kizzie Suzen SAUNDERS, CNM [facility]  07/10/2024  9:10 AM Loreli Suzen BIRCH, CNM [facility]  07/17/2024  9:10 AM Kizzie Suzen SAUNDERS, CNM [facility]    No orders of the defined types were placed in this  encounter.  Suzen SAUNDERS Kizzie CNM, [facility] 05/29/2024 11:20 AM

## 2024-05-30 ENCOUNTER — Ambulatory Visit: Payer: Self-pay | Admitting: Women's Health

## 2024-05-30 ENCOUNTER — Other Ambulatory Visit: Payer: Self-pay | Admitting: Women's Health

## 2024-05-30 DIAGNOSIS — R8271 Bacteriuria: Secondary | ICD-10-CM

## 2024-05-30 DIAGNOSIS — O24419 Gestational diabetes mellitus in pregnancy, unspecified control: Secondary | ICD-10-CM | POA: Insufficient documentation

## 2024-05-30 DIAGNOSIS — O24410 Gestational diabetes mellitus in pregnancy, diet controlled: Secondary | ICD-10-CM

## 2024-05-30 MED ORDER — GLUCOSE BLOOD VI STRP: ORAL_STRIP | 12 refills | Status: DC

## 2024-05-30 MED ORDER — FERROUS SULFATE 325 (65 FE) MG PO TABS: 325.0000 mg | ORAL_TABLET | ORAL | 2 refills | Status: DC

## 2024-05-30 MED ORDER — ACCU-CHEK GUIDE ME W/DEVICE KIT: 1.0000 | PACK | Freq: Four times a day (QID) | 0 refills | Status: DC

## 2024-05-30 MED ORDER — ACCU-CHEK SOFTCLIX LANCETS MISC: 12 refills | Status: DC

## 2024-05-31 LAB — CBC
HCT: 31.6 % — ABNORMAL LOW (ref 35.0–45.0)
Hemoglobin: 10.4 g/dL — ABNORMAL LOW (ref 11.7–15.5)
MCH: 32.5 pg (ref 27.0–33.0)
MCHC: 32.9 g/dL (ref 32.0–36.0)
MCV: 98.8 fL (ref 80.0–100.0)
MPV: 10.4 fL (ref 7.5–12.5)
Platelets: 202 Thousand/uL (ref 140–400)
RBC: 3.2 Million/uL — ABNORMAL LOW (ref 3.80–5.10)
RDW: 12.3 % (ref 11.0–15.0)
WBC: 11.6 Thousand/uL — ABNORMAL HIGH (ref 3.8–10.8)

## 2024-05-31 LAB — RUBELLA SCREEN: Rubella: 7.03 {index}

## 2024-05-31 LAB — ANTIBODY SCREEN: Antibody Screen: POSITIVE — AB

## 2024-05-31 LAB — ANTIBODY ID, TITER, AND TYPING, RBC
Antigen Typing (2): NEGATIVE
Titer: 1:1 {titer}

## 2024-05-31 LAB — GLUCOSE TOLERANCE, 2 HOURS W/ 1HR
Glucose, 1 hour: 190 mg/dL (ref 65–199)
Glucose, 2 hour: 91 mg/dL (ref 65–139)
Glucose, Fasting: 125 mg/dL — ABNORMAL HIGH (ref 65–99)

## 2024-05-31 LAB — RPR: RPR Ser Ql: NONREACTIVE

## 2024-05-31 LAB — URINE CULTURE
MICRO NUMBER:: 16677945
SPECIMEN QUALITY:: ADEQUATE

## 2024-05-31 LAB — HIV ANTIBODY (ROUTINE TESTING W REFLEX): HIV 1&2 Ab, 4th Generation: NONREACTIVE

## 2024-05-31 LAB — HEPATITIS B SURFACE ANTIGEN: Hepatitis B Surface Ag: NONREACTIVE

## 2024-05-31 LAB — HEPATITIS C ANTIBODY: Hepatitis C Ab: NONREACTIVE

## 2024-06-03 MED ORDER — NITROFURANTOIN MONOHYD MACRO 100 MG PO CAPS: 100.0000 mg | ORAL_CAPSULE | Freq: Two times a day (BID) | ORAL | 0 refills | Status: DC

## 2024-06-04 ENCOUNTER — Ambulatory Visit

## 2024-06-04 DIAGNOSIS — Z87440 Personal history of urinary (tract) infections: Secondary | ICD-10-CM | POA: Diagnosis not present

## 2024-06-04 MED ORDER — CEFTRIAXONE SODIUM 500 MG IJ SOLR
1000.0000 mg | Freq: Once | INTRAMUSCULAR | Status: AC
Start: 2024-06-04 — End: 2024-06-04
  Administered 2024-06-04: 1000 mg via INTRAMUSCULAR

## 2024-06-04 NOTE — Progress Notes (Signed)
   NURSE VISIT- INJECTION  SUBJECTIVE:  Pamela [name] is a 28 y.o. G12P0000 female here for a Rocephin  for treatment for UTI. She is [redacted]w[redacted]d pregnant.   OBJECTIVE:  LMP 10/31/2023   Appears well, in no apparent distress  Injection administered in: Left upper quad. gluteus  Meds ordered this encounter  Medications   cefTRIAXone  (ROCEPHIN ) injection 1,000 mg    ASSESSMENT: Pregnancy [redacted]w[redacted]d Rocephin  for treatment for UTI PLAN: Follow-up: as needed   Aleck FORBES Blase  06/04/2024 4:24 PM

## 2024-06-05 ENCOUNTER — Encounter: Attending: Women's Health | Admitting: Nutrition

## 2024-06-12 ENCOUNTER — Ambulatory Visit: Admitting: Women's Health

## 2024-06-12 ENCOUNTER — Encounter: Payer: Self-pay | Admitting: Women's Health

## 2024-06-12 VITALS — BP 120/75 | HR 81 | Wt 171.0 lb

## 2024-06-12 DIAGNOSIS — O26893 Other specified pregnancy related conditions, third trimester: Secondary | ICD-10-CM

## 2024-06-12 DIAGNOSIS — Z3A34 34 weeks gestation of pregnancy: Secondary | ICD-10-CM | POA: Diagnosis not present

## 2024-06-12 DIAGNOSIS — R8271 Bacteriuria: Secondary | ICD-10-CM

## 2024-06-12 DIAGNOSIS — O0993 Supervision of high risk pregnancy, unspecified, third trimester: Secondary | ICD-10-CM | POA: Diagnosis not present

## 2024-06-12 DIAGNOSIS — O99891 Other specified diseases and conditions complicating pregnancy: Secondary | ICD-10-CM

## 2024-06-12 DIAGNOSIS — Z6791 Unspecified blood type, Rh negative: Secondary | ICD-10-CM | POA: Diagnosis not present

## 2024-06-12 DIAGNOSIS — O0990 Supervision of high risk pregnancy, unspecified, unspecified trimester: Secondary | ICD-10-CM

## 2024-06-12 DIAGNOSIS — O24410 Gestational diabetes mellitus in pregnancy, diet controlled: Secondary | ICD-10-CM

## 2024-06-12 LAB — POCT URINALYSIS DIPSTICK OB
Glucose, UA: NEGATIVE
Ketones, UA: NEGATIVE
Nitrite, UA: NEGATIVE

## 2024-06-12 NOTE — Patient Instructions (Signed)
 Con, thank you for choosing our office today! We appreciate the opportunity to meet your healthcare needs. You may receive a short survey by mail, e-mail, or through [employer]. If you are happy with your care we would appreciate if you could take just a few minutes to complete the survey questions. We read all of your comments and take your feedback very seriously. Thank you again for choosing our office.  Center for [employer] Team at [facility]  [facility] at [facility] ([address] [place], [place] [address]) Entrance C, located off of [address] Free 24/7 valet parking   CLASSES: Go to [email or web address] to register for classes (childbirth, breastfeeding, waterbirth, infant CPR, daddy bootcamp, etc.)  Call the office [phone]) or go to [facility] if: You begin to have strong, frequent contractions Your water breaks.  Sometimes it is a big gush of fluid, sometimes it is just a trickle that keeps getting your panties wet or running down your legs You have vaginal bleeding.  It is normal to have a small amount of spotting if your cervix was checked.  You don't feel your baby moving like normal.  If you don't, get you something to eat and drink and lay down and focus on feeling your baby move.   If your baby is still not moving like normal, you should call the office or go to [facility].  Call the office [phone]) or go to [facility] hospital for these signs of pre-eclampsia: Severe headache that does not go away with Tylenol Visual changes- seeing spots, double, blurred vision Pain under your right breast or upper abdomen that does not go away with Tums or heartburn medicine Nausea and/or vomiting Severe swelling in your hands, feet, and face   Tdap Vaccine It is recommended that you get the Tdap vaccine during the third trimester of EACH pregnancy to help protect your baby from getting pertussis (whooping cough) 27-36 weeks is the BEST time to do  this so that you can pass the protection on to your baby. During pregnancy is better than after pregnancy, but if you are unable to get it during pregnancy it will be offered at the hospital.  You can get this vaccine with us , at the health department, your family doctor, or some local pharmacies Everyone who will be around your baby should also be up-to-date on their vaccines before the baby comes. Adults (who are not pregnant) only need 1 dose of Tdap during adulthood.   [facility] Pediatricians/Family Doctors [facility]): [address] Luba BROCKS, [phone]           [facility]: [address], [phone]                [facility]): [address], [phone] (call to ask if accepting patients) [facility] Department: [address], [place], [phone]    [facility] Pediatricians/Family Doctors [facility] [phone], [phone] [facility]: [address] [place], [phone] [facility] [place]: [address], [phone]  [facility]  [facility]): [phone] [facility]: [address], [phone]   [facility] Doctors [facility]: [address] [phone]  [facility] Family Doctors  [facility]: [phone], [phone]  Home Blood Pressure Monitoring for Patients   Your provider has recommended that you check your  blood pressure (BP) at least once a week at home. If you do not have a blood pressure cuff at home, one will be provided for you. Contact your provider if you have not received your monitor within 1 week.   Helpful Tips for Accurate Home Blood Pressure Checks  Don't smoke, exercise, or drink caffeine 30 minutes before checking your BP Use the restroom before checking your BP (a full bladder can raise your  pressure) Relax in a comfortable upright chair Feet on the ground Left arm resting comfortably on a flat surface at the level of your heart Legs uncrossed Back supported Sit quietly and don't talk Place the cuff on your bare arm Adjust snuggly, so that only two fingertips can fit between your skin and the top of the cuff Check 2 readings separated by at least one minute Keep a log of your BP readings For a visual, please reference this diagram: http://ccnc.care/bpdiagram  Provider Name: [facility]     Phone: [phone]  Zone 1: ALL CLEAR  Continue to monitor your symptoms:  BP reading is less than 140 (top number) or less than 90 (bottom number)  No right upper stomach pain No headaches or seeing spots No feeling nauseated or throwing up No swelling in face and hands  Zone 2: CAUTION Call your doctor's office for any of the following:  BP reading is greater than 140 (top number) or greater than 90 (bottom number)  Stomach pain under your ribs in the middle or right side Headaches or seeing spots Feeling nauseated or throwing up Swelling in face and hands  Zone 3: EMERGENCY  Seek immediate medical care if you have any of the following:  BP reading is greater than160 (top number) or greater than 110 (bottom number) Severe headaches not improving with Tylenol Serious difficulty catching your breath Any worsening symptoms from Zone 2  Preterm Labor and Birth Information  The normal length of a pregnancy is 39-41 weeks. Preterm labor is when labor starts before 37 completed weeks of pregnancy. What are the risk factors for preterm labor? Preterm labor is more likely to occur in women who: Have certain infections during pregnancy such as a bladder infection, sexually transmitted infection, or infection inside the uterus (chorioamnionitis). Have a shorter-than-normal cervix. Have gone into preterm labor before. Have had surgery on their cervix. Are younger than age 66  or older than age 44. Are African American. Are pregnant with twins or multiple babies (multiple gestation). Take street drugs or smoke while pregnant. Do not gain enough weight while pregnant. Became pregnant shortly after having been pregnant. What are the symptoms of preterm labor? Symptoms of preterm labor include: Cramps similar to those that can happen during a menstrual period. The cramps may happen with diarrhea. Pain in the abdomen or lower back. Regular uterine contractions that may feel like tightening of the abdomen. A feeling of increased pressure in the pelvis. Increased watery or bloody mucus discharge from the vagina. Water breaking (ruptured amniotic sac). Why is it important to recognize signs of preterm labor? It is important to recognize signs of preterm labor because babies who are born prematurely may not be fully developed. This can put them at an increased risk for: Long-term (chronic) heart and lung problems. Difficulty immediately after birth with regulating body systems, including blood sugar, body temperature, heart rate, and breathing rate. Bleeding in the brain. Cerebral palsy. Learning difficulties. Death. These risks are highest for babies who are born before 34 weeks  of pregnancy. How is preterm labor treated? Treatment depends on the length of your pregnancy, your condition, and the health of your baby. It may involve: Having a stitch (suture) placed in your cervix to prevent your cervix from opening too early (cerclage). Taking or being given medicines, such as: Hormone medicines. These may be given early in pregnancy to help support the pregnancy. Medicine to stop contractions. Medicines to help mature the baby's lungs. These may be prescribed if the risk of delivery is high. Medicines to prevent your baby from developing cerebral palsy. If the labor happens before 34 weeks of pregnancy, you may need to stay in the hospital. What should I do if I  think I am in preterm labor? If you think that you are going into preterm labor, call your health care provider right away. How can I prevent preterm labor in future pregnancies? To increase your chance of having a full-term pregnancy: Do not use any tobacco products, such as cigarettes, chewing tobacco, and e-cigarettes. If you need help quitting, ask your health care provider. Do not use street drugs or medicines that have not been prescribed to you during your pregnancy. Talk with your health care provider before taking any herbal supplements, even if you have been taking them regularly. Make sure you gain a healthy amount of weight during your pregnancy. Watch for infection. If you think that you might have an infection, get it checked right away. Make sure to tell your health care provider if you have gone into preterm labor before. This information is not intended to replace advice given to you by your health care provider. Make sure you discuss any questions you have with your health care provider. Document Revised: 03/01/2019 Document Reviewed: 03/30/2016 Elsevier Patient Education  2020 [employer].

## 2024-06-12 NOTE — Progress Notes (Signed)
 HIGH-RISK PREGNANCY VISIT Patient name: Pamela [name] MRN [id number]  Date of birth: [birth year] Chief Complaint:   Routine Prenatal Visit  History of Present Illness:   Pamela [name] is a 28 y.o. G1P0000 female at [redacted]w[redacted]d with an Estimated Date of Delivery: 07/24/24 being seen today for ongoing management of a high-risk pregnancy complicated by diabetes mellitus A1DM.    Today she reports not checking sugars consistently. Has to reschedule DM class. Contractions: Irritability. Vag. Bleeding: None.  Movement: Present. denies leaking of fluid.      01/10/2024   10:27 AM 07/02/2020    2:27 PM 07/24/2019    3:08 PM 06/28/2019    3:11 PM 12/27/2018   10:43 AM  Depression screen PHQ 2/9  Decreased Interest 0 2 0 2 0  Down, Depressed, Hopeless 0 1 0 2 0  PHQ - 2 Score 0 3 0 4 0  Altered sleeping 0 3 1 2  0  Tired, decreased energy 1 2 1 2  0  Change in appetite 0 3 0 0 0  Feeling bad or failure about yourself  0 1 0 1 0  Trouble concentrating 0 2 0 1 0  Moving slowly or fidgety/restless 0 0 0 0 0  Suicidal thoughts 0 0 0 0 0  PHQ-9 Score 1 14 2 10  0  Difficult doing work/chores   Not difficult at all          01/10/2024   10:27 AM 07/02/2020    2:29 PM 07/24/2019    3:11 PM 06/28/2019    3:11 PM  GAD 7 : Generalized Anxiety Score  Nervous, Anxious, on Edge 0 1 2 2   Control/stop worrying 0 1 0 1  Worry too much - different things 0 1 0 2  Trouble relaxing 0 1 0 3  Restless 0 0 0 0  Easily annoyed or irritable 0 1 0 2  Afraid - awful might happen 0 0 0 1  Total GAD 7 Score 0 5 2 11   Anxiety Difficulty   Not difficult at all      Review of Systems:   Pertinent items are noted in HPI Denies abnormal vaginal discharge w/ itching/odor/irritation, headaches, visual changes, shortness of breath, chest pain, abdominal pain, severe nausea/vomiting, or problems with urination or bowel movements unless otherwise stated above. Pertinent History Reviewed:  Reviewed past medical,surgical, social,  obstetrical and family history.  Reviewed problem list, medications and allergies. Physical Assessment:   Vitals:   06/12/24 1054  BP: 120/75  Pulse: 81  Weight: 171 lb (77.6 kg)  Body mass index is 30.29 kg/m.           Physical Examination:   General appearance: alert, well appearing, and in no distress  Mental status: alert, oriented to person, place, and time  Skin: warm & dry   Extremities: Edema: Trace    Cardiovascular: normal heart rate noted  Respiratory: normal respiratory effort, no distress  Abdomen: gravid, soft, non-tender  Pelvic: Cervical exam deferred         Fetal Status: Fetal Heart Rate (bpm): 144 Fundal Height: 32 cm Movement: Present    Fetal Surveillance Testing today: doppler   Chaperone: N/A  Results for orders placed or performed in visit on 06/12/24 (from the past 24 hours)  POC Urinalysis Dipstick OB   Collection Time: 06/12/24 10:58 AM  Result Value Ref Range   Color, UA     Clarity, UA     Glucose, UA Negative Negative   Bilirubin,  UA     Ketones, UA neg    Spec Grav, UA     Blood, UA trace    pH, UA     POC,PROTEIN,UA Trace Negative, Trace, Small (1+), Moderate (2+), Large (3+), 4+   Urobilinogen, UA     Nitrite, UA neg    Leukocytes, UA Large (3+) (A) Negative   Appearance     Odor      Assessment & Plan:  High-risk pregnancy: G1P0000 at [redacted]w[redacted]d with an Estimated Date of Delivery: 07/24/24   1) A1DM, reschedule DM class, set alarm to remind to check sugars QID, write out in log (or on phone) and send me a picture Monday am via MyChart. Decrease carbs/increase protein, protein snack at bedtime, activity/walking after meals. EFW next visit  2) Recent ASB, urine cx poc today  Meds: No orders of the defined types were placed in this encounter.   Labs/procedures today: urine culture  Treatment Plan:  EFW next visit, IOL 39-39.6w if no meds  Reviewed: Preterm labor symptoms and general obstetric precautions including but not limited  to vaginal bleeding, contractions, leaking of fluid and fetal movement were reviewed in detail with the patient.  All questions were answered. Does have home bp cuff. Office bp cuff given: not applicable. Check bp weekly, let us  know if consistently >140 and/or >90.  Follow-up: Return for As scheduled 8/6, add EFW u/s.   Future Appointments  Date Time Provider Department Center  06/26/2024  9:10 AM Loreli Suzen BIRCH, CNM [facility]  07/03/2024  9:10 AM Kizzie Suzen SAUNDERS, CNM [facility]  07/10/2024  9:10 AM Loreli Suzen BIRCH, CNM [facility]  07/17/2024  9:10 AM Kizzie Suzen SAUNDERS, CNM [facility]    Orders Placed This Encounter  Procedures   Urine Culture   US  OB Follow Up   POC Urinalysis Dipstick OB   Suzen SAUNDERS Kizzie CNM, [facility] 06/12/2024 11:18 AM

## 2024-06-14 LAB — URINE CULTURE
MICRO NUMBER:: 16736624
SPECIMEN QUALITY:: ADEQUATE

## 2024-06-17 ENCOUNTER — Telehealth: Payer: Self-pay

## 2024-06-17 NOTE — Telephone Encounter (Signed)
 Pt states she is 34 weeks and for the last few nights; she thinks she stops breathing while sleeping, would like for a nurse to call her.

## 2024-06-18 ENCOUNTER — Ambulatory Visit: Payer: Self-pay | Admitting: Women's Health

## 2024-06-24 ENCOUNTER — Ambulatory Visit

## 2024-06-24 ENCOUNTER — Ambulatory Visit: Admitting: Advanced Practice Midwife

## 2024-06-24 VITALS — BP 126/78 | HR 70 | Wt 165.0 lb

## 2024-06-24 DIAGNOSIS — Z3A35 35 weeks gestation of pregnancy: Secondary | ICD-10-CM

## 2024-06-24 DIAGNOSIS — O24410 Gestational diabetes mellitus in pregnancy, diet controlled: Secondary | ICD-10-CM

## 2024-06-24 DIAGNOSIS — O0990 Supervision of high risk pregnancy, unspecified, unspecified trimester: Secondary | ICD-10-CM

## 2024-06-24 DIAGNOSIS — O0993 Supervision of high risk pregnancy, unspecified, third trimester: Secondary | ICD-10-CM | POA: Diagnosis not present

## 2024-06-24 DIAGNOSIS — Z6791 Unspecified blood type, Rh negative: Secondary | ICD-10-CM

## 2024-06-24 DIAGNOSIS — R8271 Bacteriuria: Secondary | ICD-10-CM

## 2024-06-24 DIAGNOSIS — O26893 Other specified pregnancy related conditions, third trimester: Secondary | ICD-10-CM | POA: Diagnosis not present

## 2024-06-24 NOTE — Progress Notes (Signed)
 US  35+5 wks,cephalic,FHR 131 bpm,posterior placenta gr 3,AFI 15 cm,EFW 2704 g 45%

## 2024-06-24 NOTE — Progress Notes (Addendum)
 HIGH-RISK PREGNANCY VISIT Patient name: Pamela [name] MRN [id number]  Date of birth: [birth year] Chief Complaint:   Routine Prenatal Visit  History of Present Illness:   Pamela [name] is a 28 y.o. G1P0000 female at [redacted]w[redacted]d with an Estimated Date of Delivery: 07/24/24 being seen today for ongoing management of a high-risk pregnancy complicated by A1GDM.    Today she reports no complaints. Contractions: Irritability. Vag. Bleeding: None.  Movement: Present. Denies leaking of fluid.   Review of Systems:   Pertinent items are noted in HPI Denies abnormal vaginal discharge w/ itching/odor/irritation, headaches, visual changes, shortness of breath, chest pain, abdominal pain, severe nausea/vomiting, or problems with urination or bowel movements unless otherwise stated above. Pertinent History Reviewed:  Reviewed past medical,surgical, social, obstetrical and family history.  Reviewed problem list, medications and allergies. Physical Assessment:   Vitals:   06/24/24 1032  BP: 126/78  Pulse: 70  Weight: 74.8 kg  Body mass index is 29.23 kg/m.           Physical Examination:   General appearance: alert, well appearing, and in no distress  Mental status: alert, oriented to person, place, and time  Skin: warm & dry   Extremities:  No edema   Cardiovascular: normal heart rate noted  Respiratory: normal respiratory effort, no distress  Abdomen: gravid, soft, non-tender  Pelvic: Cervical exam deferred         Chaperone: N/A    Fetal Status:   Fundal Height: 35 cm Movement: Present  FHR (US ) 131  Fetal Surveillance Testing today: US  35+5 wks,cephalic,FHR 131 bpm,posterior placenta gr 3,AFI 15 cm,EFW 2704 g 45%    No results found for this or any previous visit (from the past 24 hours).  Assessment & Plan:  High-risk pregnancy: G1P0000 at [redacted]w[redacted]d with an Estimated Date of Delivery: 07/24/24   1. Supervision of high risk pregnancy, antepartum Begin weekly visits Discussed testing for  GC/CT next week No GBS testing needed, GBS bacteruria earlier in pregnancy  2. [redacted] weeks gestation of pregnancy   3. Diet controlled gestational diabetes mellitus (GDM) in third trimester (Primary) Fasting CBG: single elevated fasting of 96 in last week 2 HR postprandial: all <120, only checking after breakfast and lunch (falls asleep after dinner) US  today EFW 2704 g, 45%, AFI 15 cm  4. Rh negative state in antepartum period Rhogam given 05/15/24   Meds: No orders of the defined types were placed in this encounter.   Orders: No orders of the defined types were placed in this encounter.    Labs/procedures today: U/S   Reviewed: Preterm labor symptoms and general obstetric precautions including but not limited to vaginal bleeding, contractions, leaking of fluid and fetal movement were reviewed in detail with the patient.  All questions were answered. Does have home bp cuff. Check bp weekly, let us  know if consistently >140 and/or >90.  Follow-up: begin weekly OB visits   Future Appointments  Date Time Provider Department Center  07/03/2024  9:10 AM Kizzie Suzen SAUNDERS, CNM [facility]  07/10/2024  9:10 AM Loreli Suzen BIRCH, CNM [facility]  07/17/2024  9:10 AM Kizzie Suzen SAUNDERS, CNM [facility]    No orders of the defined types were placed in this encounter.  Vernell FORBES Ruddle, SNM 06/24/2024 12:04 PM  I personally saw and evaluated the patient, performing the key elements of the service. I developed and verified the management plan that is described in the resident's/student's note, and I agree with the content with  my edits above. VSS, HRR&R, Resp unlabored, Legs neg.  Sherrell Ely, CNM 06/24/2024 1:41 PM

## 2024-06-24 NOTE — Patient Instructions (Signed)
Round Ligament Pain During Pregnancy  ° °Round ligament pain is a sharp pain or jabbing feeling often felt in the lower belly or groin area on one or both sides. It is one of the most common complaints during pregnancy and is considered a normal part of pregnancy. It is most often felt during the second trimester.  ° °Here is what you need to know about round ligament pain, including some tips to help you feel better.  ° °Causes of Round Ligament Pain:   ° °Several thick ligaments surround and support your womb (uterus) as it grows during pregnancy. One of them is called the round ligament.  ° °The round ligament connects the front part of the womb to your groin, the area where your legs attach to your pelvis. The round ligament normally tightens and relaxes slowly.  ° °As your baby and womb grow, the round ligament stretches. That makes it more likely to become strained.  ° °Sudden movements can cause the ligament to tighten quickly, like a rubber band snapping. This causes a sudden and quick jabbing feeling.  ° °Symptoms of Round Ligament Pain  ° °Round ligament pain can be concerning and uncomfortable. But it is considered normal as your body changes during pregnancy.  ° °The symptoms of round ligament pain include a sharp, sudden spasm in the belly. It usually affects the right side, but it may happen on both sides. The pain only lasts a few seconds.  ° °Exercise may cause the pain, as will rapid movements such as:  ° °sneezing  °coughing  °laughing  °rolling over in bed  °standing up too quickly  ° °Treatment of Round Ligament Pain  ° °Here are some tips that may help reduce your discomfort:  ° °Pain relief. Take over-the-counter acetaminophen for pain, if necessary. Ask your doctor if this is OK.  ° °Exercise. Get plenty of exercise to keep your stomach (core) muscles strong. Doing stretching exercises or prenatal yoga can be helpful. Ask your doctor which exercises are safe for you and your baby.  ° °A  helpful exercise involves putting your hands and knees on the floor, lowering your head, and pushing your backside into the air.  ° °Avoid sudden movements. Change positions slowly (such as standing up or sitting down) to avoid sudden movements that may cause stretching and pain.  ° °Flex your hips. Bend and flex your hips before you cough, sneeze, or laugh to avoid pulling on the ligaments.  ° °Apply warmth. A heating pad or warm bath may be helpful. Ask your doctor if this is OK. Extreme heat can be dangerous to the baby.  ° °You should try to modify your daily activity level and avoid positions that may worsen the condition.  ° °When to Call the Doctor/Midwife  ° °Always tell your doctor or midwife about any type of pain you have during pregnancy. Round ligament pain is quick and doesn't last long.  ° °Call your health care provider immediately if you have:  ° °severe pain  °fever  °chills  °pain on urination  °difficulty walking  ° °Belly pain during pregnancy can be due to many different causes. It is important for your doctor to rule out more serious conditions, including pregnancy complications such as placenta abruption or non-pregnancy illnesses such as:  ° °inguinal hernia  °appendicitis  °stomach, liver, and kidney problems  °Preterm labor pains may sometimes be mistaken for round ligament pain.  °    °   ° ° ° °

## 2024-06-26 ENCOUNTER — Encounter: Admitting: Advanced Practice Midwife

## 2024-07-03 ENCOUNTER — Ambulatory Visit: Admitting: Women's Health

## 2024-07-03 ENCOUNTER — Encounter: Payer: Self-pay | Admitting: Women's Health

## 2024-07-03 ENCOUNTER — Other Ambulatory Visit (HOSPITAL_COMMUNITY)
Admission: RE | Admit: 2024-07-03 | Discharge: 2024-07-03 | Disposition: A | Discharge status: Home / Self Care | Source: Ambulatory Visit | Attending: Women's Health | Admitting: Women's Health

## 2024-07-03 VITALS — BP 133/88 | HR 73 | Wt 177.0 lb

## 2024-07-03 DIAGNOSIS — Z3A37 37 weeks gestation of pregnancy: Secondary | ICD-10-CM | POA: Insufficient documentation

## 2024-07-03 DIAGNOSIS — O24410 Gestational diabetes mellitus in pregnancy, diet controlled: Secondary | ICD-10-CM

## 2024-07-03 DIAGNOSIS — O0993 Supervision of high risk pregnancy, unspecified, third trimester: Secondary | ICD-10-CM

## 2024-07-03 DIAGNOSIS — Z113 Encounter for screening for infections with a predominantly sexual mode of transmission: Secondary | ICD-10-CM | POA: Diagnosis present

## 2024-07-03 DIAGNOSIS — O0990 Supervision of high risk pregnancy, unspecified, unspecified trimester: Secondary | ICD-10-CM

## 2024-07-03 LAB — OB RESULTS CONSOLE GBS: GBS: POSITIVE

## 2024-07-03 NOTE — Patient Instructions (Signed)
 Con, thank you for choosing our office today! We appreciate the opportunity to meet your healthcare needs. You may receive a short survey by mail, e-mail, or through [employer]. If you are happy with your care we would appreciate if you could take just a few minutes to complete the survey questions. We read all of your comments and take your feedback very seriously. Thank you again for choosing our office.  Center for [employer] Team at [facility]  [facility] at [facility] ([address] [place], [place] [address]) Entrance C, located off of [address] Free 24/7 valet parking   CLASSES: Go to [email or web address] to register for classes (childbirth, breastfeeding, waterbirth, infant CPR, daddy bootcamp, etc.)  Call the office [phone]) or go to [facility] if: You begin to have strong, frequent contractions Your water breaks.  Sometimes it is a big gush of fluid, sometimes it is just a trickle that keeps getting your panties wet or running down your legs You have vaginal bleeding.  It is normal to have a small amount of spotting if your cervix was checked.  You don't feel your baby moving like normal.  If you don't, get you something to eat and drink and lay down and focus on feeling your baby move.   If your baby is still not moving like normal, you should call the office or go to [facility].  Call the office [phone]) or go to [facility] hospital for these signs of pre-eclampsia: Severe headache that does not go away with Tylenol  Visual changes- seeing spots, double, blurred vision Pain under your right breast or upper abdomen that does not go away with Tums or heartburn medicine Nausea and/or vomiting Severe swelling in your hands, feet, and face   [place] [facility]/Family Doctors [facility]): [address] [place], [phone]           [facility]: [address], [phone]                 [facility]): [address], [phone] (call to ask if accepting patients) [facility] Department: [address] [place], [phone]    [facility] Pediatricians/Family Doctors [facility]): [address], [phone] [facility]: [address] [place], [phone] [facility]: [address], [phone]  [facility] Doctors  [facility]): [phone] [facility]: [address], [phone]   [facility] Doctors [facility]: [address] [phone]  [facility]  [facility]: [phone], [phone]  Home Blood Pressure Monitoring for Patients   Your provider has recommended that you check your blood pressure (BP) at least once a week at home. If you do not have a blood pressure cuff at home, one will be provided for you. Contact your provider if you have not received your monitor within 1 week.   Helpful Tips for Accurate Home Blood Pressure Checks  Don't smoke, exercise, or drink caffeine 30 minutes before checking your BP Use the restroom before checking your BP (a full bladder can raise your pressure) Relax in a comfortable upright chair Feet on the ground Left arm resting comfortably on a flat surface at the level of your heart Legs uncrossed Back supported Sit quietly and don't talk Place the cuff on your bare arm Adjust snuggly, so that only two fingertips  can fit between your skin and the top of the cuff Check 2 readings separated by at least one minute Keep a log of your BP readings For a visual, please reference this diagram: http://ccnc.care/bpdiagram  Provider Name: [facility]     Phone: [phone]  Zone 1: ALL CLEAR  Continue to monitor your symptoms:  BP reading is less than 140 (top number) or less than 90 (bottom number)  No right  upper stomach pain No headaches or seeing spots No feeling nauseated or throwing up No swelling in face and hands  Zone 2: CAUTION Call your doctor's office for any of the following:  BP reading is greater than 140 (top number) or greater than 90 (bottom number)  Stomach pain under your ribs in the middle or right side Headaches or seeing spots Feeling nauseated or throwing up Swelling in face and hands  Zone 3: EMERGENCY  Seek immediate medical care if you have any of the following:  BP reading is greater than160 (top number) or greater than 110 (bottom number) Severe headaches not improving with Tylenol  Serious difficulty catching your breath Any worsening symptoms from Zone 2   Braxton Hicks Contractions Contractions of the uterus can occur throughout pregnancy, but they are not always a sign that you are in labor. You may have practice contractions called Braxton Hicks contractions. These false labor contractions are sometimes confused with true labor. What are Darol Irving contractions? Braxton Hicks contractions are tightening movements that occur in the muscles of the uterus before labor. Unlike true labor contractions, these contractions do not result in opening (dilation) and thinning of the cervix. Toward the end of pregnancy (32-34 weeks), Braxton Hicks contractions can happen more often and may become stronger. These contractions are sometimes difficult to tell apart from true labor because they can be very uncomfortable. You should not feel embarrassed if you go to the hospital with false labor. Sometimes, the only way to tell if you are in true labor is for your health care provider to look for changes in the cervix. The health care provider will do a physical exam and may monitor your contractions. If you are not in true labor, the exam should show that your cervix is not dilating and your water has not broken. If there are no other health problems associated with your  pregnancy, it is completely safe for you to be sent home with false labor. You may continue to have Braxton Hicks contractions until you go into true labor. How to tell the difference between true labor and false labor True labor Contractions last 30-70 seconds. Contractions become very regular. Discomfort is usually felt in the top of the uterus, and it spreads to the lower abdomen and low back. Contractions do not go away with walking. Contractions usually become more intense and increase in frequency. The cervix dilates and gets thinner. False labor Contractions are usually shorter and not as strong as true labor contractions. Contractions are usually irregular. Contractions are often felt in the front of the lower abdomen and in the groin. Contractions may go away when you walk around or change positions while lying down. Contractions get weaker and are shorter-lasting as time goes on. The cervix usually does not dilate or become thin. Follow these instructions at home:  Take over-the-counter and prescription medicines only as told by your health care provider. Keep up with your usual exercises and follow other instructions from your health care provider. Eat and drink lightly if you think  you are going into labor. If Braxton Hicks contractions are making you uncomfortable: Change your position from lying down or resting to walking, or change from walking to resting. Sit and rest in a tub of warm water. Drink enough fluid to keep your urine pale yellow. Dehydration may cause these contractions. Do slow and deep breathing several times an hour. Keep all follow-up prenatal visits as told by your health care provider. This is important. Contact a health care provider if: You have a fever. You have continuous pain in your abdomen. Get help right away if: Your contractions become stronger, more regular, and closer together. You have fluid leaking or gushing from your vagina. You pass  blood-tinged mucus (bloody show). You have bleeding from your vagina. You have low back pain that you never had before. You feel your baby's head pushing down and causing pelvic pressure. Your baby is not moving inside you as much as it used to. Summary Contractions that occur before labor are called Braxton Hicks contractions, false labor, or practice contractions. Braxton Hicks contractions are usually shorter, weaker, farther apart, and less regular than true labor contractions. True labor contractions usually become progressively stronger and regular, and they become more frequent. Manage discomfort from [facility] contractions by changing position, resting in a warm bath, drinking plenty of water, or practicing deep breathing. This information is not intended to replace advice given to you by your health care provider. Make sure you discuss any questions you have with your health care provider. Document Revised: 10/20/2017 Document Reviewed: 03/23/2017 Elsevier Patient Education  2020 [employer].

## 2024-07-03 NOTE — Progress Notes (Addendum)
 HIGH-RISK PREGNANCY VISIT Patient name: Pamela [name] MRN [id number]  Date of birth: 11/20/1996 Chief Complaint:   Routine Prenatal Visit (culture)  History of Present Illness:   Pamela [name] is a 28 y.o. G1P0000 female at [redacted]w[redacted]d with an Estimated Date of Delivery: 07/24/24 being seen today for ongoing management of a high-risk pregnancy complicated by diabetes mellitus A1DM.    Today she reports all sugars wnl. Felt bad when bp being checked, was getting nauseated. Home bp's have been wnl. Denies ha, visual changes, ruq/epigastric pain, n/v.  Contractions: Irregular.  .  Movement: Present. denies leaking of fluid.      01/10/2024   10:27 AM 07/02/2020    2:27 PM 07/24/2019    3:08 PM 06/28/2019    3:11 PM 12/27/2018   10:43 AM  Depression screen PHQ 2/9  Decreased Interest 0 2 0 2 0  Down, Depressed, Hopeless 0 1 0 2 0  PHQ - 2 Score 0 3 0 4 0  Altered sleeping 0 3 1 2  0  Tired, decreased energy 1 2 1 2  0  Change in appetite 0 3 0 0 0  Feeling bad or failure about yourself  0 1 0 1 0  Trouble concentrating 0 2 0 1 0  Moving slowly or fidgety/restless 0 0 0 0 0  Suicidal thoughts 0 0 0 0 0  PHQ-9 Score 1 14 2 10  0  Difficult doing work/chores   Not difficult at all          01/10/2024   10:27 AM 07/02/2020    2:29 PM 07/24/2019    3:11 PM 06/28/2019    3:11 PM  GAD 7 : Generalized Anxiety Score  Nervous, Anxious, on Edge 0 1 2 2   Control/stop worrying 0 1 0 1  Worry too much - different things 0 1 0 2  Trouble relaxing 0 1 0 3  Restless 0 0 0 0  Easily annoyed or irritable 0 1 0 2  Afraid - awful might happen 0 0 0 1  Total GAD 7 Score 0 5 2 11   Anxiety Difficulty   Not difficult at all      Review of Systems:   Pertinent items are noted in HPI Denies abnormal vaginal discharge w/ itching/odor/irritation, headaches, visual changes, shortness of breath, chest pain, abdominal pain, severe nausea/vomiting, or problems with urination or bowel movements unless otherwise stated  above. Pertinent History Reviewed:  Reviewed past medical,surgical, social, obstetrical and family history.  Reviewed problem list, medications and allergies. Physical Assessment:   Vitals:   07/03/24 0927 07/03/24 1000  BP: (!) 131/90 133/88  Pulse: 73   Weight: 177 lb (80.3 kg)   Body mass index is 31.35 kg/m.           Physical Examination:   General appearance: alert, well appearing, and in no distress  Mental status: alert, oriented to person, place, and time  Skin: warm & dry   Extremities: Edema: Trace    Cardiovascular: normal heart rate noted  Respiratory: normal respiratory effort, no distress  Abdomen: gravid, soft, non-tender  Pelvic: Cervical exam performed  Dilation: Closed Effacement (%): Thick Station: Ballotable  Fetal Status: Fetal Heart Rate (bpm): 142 Fundal Height: 36 cm Movement: Present Presentation: Vertex  Fetal Surveillance Testing today: doppler   Chaperone: Peggy Dones  No results found for this or any previous visit (from the past 24 hours).  Assessment & Plan:  High-risk pregnancy: G1P0000 at [redacted]w[redacted]d with an Estimated Date of  Delivery: 07/24/24   1) A1DM, stable, EFW 45%/normal AFI @ 35.5w, IOL scheduled for 8/27MN,  IOL form faxed and orders placed  2) Initial bp elevated, better on recheck, got hot in room and feels nauseated. Home bp's have been wnl and asymptomatic. Check bp's daily, if >140/90 or pre-e s/s, let us  know  Meds: No orders of the defined types were placed in this encounter.   Labs/procedures today: GBS, GC/CT, and SVE (GBS+urine, cx for sensitivities today)  Treatment Plan:  No antenatal testing as long as remains off meds     Deliver @ 39-39.6wks:____   Reviewed: Term labor symptoms and general obstetric precautions including but not limited to vaginal bleeding, contractions, leaking of fluid and fetal movement were reviewed in detail with the patient.  All questions were answered. Does have home bp cuff. Office bp cuff given:  not applicable. Check bp daily, let us  know if consistently >140 and/or >90.  Follow-up: Return for As scheduled.   Future Appointments  Date Time Provider Department Center  07/10/2024  9:10 AM Pamela [name], CNM [facility]  07/17/2024 12:00 AM [facility] SCHED ROOM [facility] None  07/17/2024  9:10 AM Pamela [name], CNM [facility]    Orders Placed This Encounter  Procedures   Strep Gp B NAA+Rflx   [name] CNM, [facility] 07/03/2024 10:51 AM

## 2024-07-03 NOTE — Addendum Note (Signed)
 Addended by: KIZZIE SUZEN SAUNDERS on: 07/03/2024 10:51 AM   Modules accepted: Orders

## 2024-07-04 LAB — CERVICOVAGINAL ANCILLARY ONLY
Chlamydia: NEGATIVE
Comment: NEGATIVE
Comment: NORMAL
Neisseria Gonorrhea: NEGATIVE

## 2024-07-07 LAB — STREP GP B SUSCEPTIBILITY

## 2024-07-07 LAB — STREP GP B NAA+RFLX: Strep Gp B NAA+Rflx: POSITIVE — AB

## 2024-07-08 ENCOUNTER — Ambulatory Visit: Payer: Self-pay | Admitting: Women's Health

## 2024-07-10 ENCOUNTER — Ambulatory Visit: Admitting: Advanced Practice Midwife

## 2024-07-10 ENCOUNTER — Encounter: Payer: Self-pay | Admitting: Advanced Practice Midwife

## 2024-07-10 VITALS — BP 126/79 | HR 113

## 2024-07-10 DIAGNOSIS — R8271 Bacteriuria: Secondary | ICD-10-CM | POA: Diagnosis not present

## 2024-07-10 DIAGNOSIS — O0993 Supervision of high risk pregnancy, unspecified, third trimester: Secondary | ICD-10-CM | POA: Diagnosis not present

## 2024-07-10 DIAGNOSIS — Z3A38 38 weeks gestation of pregnancy: Secondary | ICD-10-CM

## 2024-07-10 DIAGNOSIS — O0990 Supervision of high risk pregnancy, unspecified, unspecified trimester: Secondary | ICD-10-CM

## 2024-07-10 DIAGNOSIS — Z331 Pregnant state, incidental: Secondary | ICD-10-CM

## 2024-07-10 DIAGNOSIS — Z1389 Encounter for screening for other disorder: Secondary | ICD-10-CM | POA: Diagnosis not present

## 2024-07-10 LAB — POCT URINALYSIS DIPSTICK OB
Blood, UA: NEGATIVE
Glucose, UA: NEGATIVE
Ketones, UA: NEGATIVE
Nitrite, UA: NEGATIVE

## 2024-07-10 NOTE — Progress Notes (Signed)
 HIGH-RISK PREGNANCY VISIT Patient name: Pamela [name] MRN [id number]  Date of birth: [birth year] Chief Complaint:   Routine Prenatal Visit  History of Present Illness:   Pamela [name] is a 28 y.o. G1P0000 female at [redacted]w[redacted]d with an Estimated Date of Delivery: 07/24/24 being seen today for ongoing management of a high-risk pregnancy complicated by diabetes mellitus A1DM.    Today she reports no complaints. Contractions: Not present.  .  Movement: Present. denies leaking of fluid.      01/10/2024   10:27 AM 07/02/2020    2:27 PM 07/24/2019    3:08 PM 06/28/2019    3:11 PM 12/27/2018   10:43 AM  Depression screen PHQ 2/9  Decreased Interest 0 2 0 2 0  Down, Depressed, Hopeless 0 1 0 2 0  PHQ - 2 Score 0 3 0 4 0  Altered sleeping 0 3 1 2  0  Tired, decreased energy 1 2 1 2  0  Change in appetite 0 3 0 0 0  Feeling bad or failure about yourself  0 1 0 1 0  Trouble concentrating 0 2 0 1 0  Moving slowly or fidgety/restless 0 0 0 0 0  Suicidal thoughts 0 0 0 0 0  PHQ-9 Score 1 14 2 10  0  Difficult doing work/chores   Not difficult at all          01/10/2024   10:27 AM 07/02/2020    2:29 PM 07/24/2019    3:11 PM 06/28/2019    3:11 PM  GAD 7 : Generalized Anxiety Score  Nervous, Anxious, on Edge 0 1 2 2   Control/stop worrying 0 1 0 1  Worry too much - different things 0 1 0 2  Trouble relaxing 0 1 0 3  Restless 0 0 0 0  Easily annoyed or irritable 0 1 0 2  Afraid - awful might happen 0 0 0 1  Total GAD 7 Score 0 5 2 11   Anxiety Difficulty   Not difficult at all      Review of Systems:   Pertinent items are noted in HPI Denies abnormal vaginal discharge w/ itching/odor/irritation, headaches, visual changes, shortness of breath, chest pain, abdominal pain, severe nausea/vomiting, or problems with urination or bowel movements unless otherwise stated above. Pertinent History Reviewed:  Reviewed past medical,surgical, social, obstetrical and family history.  Reviewed problem list, medications  and allergies. Physical Assessment:   Vitals:   07/10/24 0916 07/10/24 0924  BP: (!) 148/92 126/79  Pulse: (!) 113 (!) 113  There is no height or weight on file to calculate BMI.           Physical Examination:   General appearance: alert, well appearing, and in no distress  Mental status: alert, oriented to person, place, and time  Skin: warm & dry   Extremities: Edema: Trace    Cardiovascular: normal heart rate noted  Respiratory: normal respiratory effort, no distress  Abdomen: gravid, soft, non-tender  Pelvic: Cervical exam deferred         Fetal Status: Fetal Heart Rate (bpm): 130 Fundal Height: 36 cm Movement: Present Presentation: Vertex  Fetal Surveillance Testing today: doppler    Results for orders placed or performed in visit on 07/10/24 (from the past 24 hours)  POC Urinalysis Dipstick OB   Collection Time: 07/10/24  9:36 AM  Result Value Ref Range   Color, UA     Clarity, UA     Glucose, UA Negative Negative   Bilirubin, UA  Ketones, UA negative    Spec Grav, UA     Blood, UA negative    pH, UA     POC,PROTEIN,UA Trace Negative, Trace, Small (1+), Moderate (2+), Large (3+), 4+   Urobilinogen, UA     Nitrite, UA negative    Leukocytes, UA Large (3+) (A) Negative   Appearance     Odor      Assessment & Plan:  High-risk pregnancy: G1P0000 at [redacted]w[redacted]d with an Estimated Date of Delivery: 07/24/24   1) A1GDM, reports all fasting values in the 80s; all PP values <115; EFW 45% @ 35.5wks; IOL sched already for 8/27 (39.0wks); rev'd IOL process  2) Labile BP, initially upon arrival & check in last visit and this visit she hasn't felt well and had ^ values with normal repeat values; is asymptomatic for pre-e; checks daily at home and has never had a value of 140/90; rev'd s/s and to call if gets high values at home, otherwise will keep scheduled IOL  3) PCN listed as allergy, further discussion shows it is because she rec'd it as a child so much that the 'cillins' no  longer are effective; she doesn't actually have an allergic response; states there are others in her family who have experienced this as well; her GBS is clinda-resistant; in-hospital provider at time of induction to determine best course of action  Meds: No orders of the defined types were placed in this encounter.   Labs/procedures today: none  Treatment Plan:  for IOL 8/27 (knows to arrive 8/26 @ 2345)  Reviewed: Term labor symptoms and general obstetric precautions including but not limited to vaginal bleeding, contractions, leaking of fluid and fetal movement were reviewed in detail with the patient.  All questions were answered. Does have home bp cuff. Office bp cuff given: not applicable. Check bp twice daily, let us  know if consistently >140 and/or >90.  Follow-up: Return for 7wk PP visit with GTT.   Future Appointments  Date Time Provider Department Center  07/17/2024 12:00 AM [facility] SCHED ROOM [facility] None  08/28/2024  8:30 AM [facility]  08/28/2024  9:30 AM Kizzie Suzen SAUNDERS, CNM [facility]    Orders Placed This Encounter  Procedures   POC Urinalysis Dipstick OB   Suzen JONETTA Gentry [facility] 07/10/2024 10:31 AM

## 2024-07-17 ENCOUNTER — Encounter: Admitting: Women's Health

## 2024-07-17 ENCOUNTER — Inpatient Hospital Stay (HOSPITAL_COMMUNITY)

## 2024-07-18 ENCOUNTER — Encounter (HOSPITAL_COMMUNITY): Payer: Self-pay | Admitting: Obstetrics & Gynecology

## 2024-07-18 ENCOUNTER — Other Ambulatory Visit: Payer: Self-pay

## 2024-07-18 ENCOUNTER — Inpatient Hospital Stay (HOSPITAL_COMMUNITY)
Admission: RE | Admit: 2024-07-18 | Discharge: 2024-07-21 | DRG: 806 | Disposition: A | Discharge status: Home / Self Care | Attending: Obstetrics & Gynecology | Admitting: Obstetrics & Gynecology

## 2024-07-18 DIAGNOSIS — Z833 Family history of diabetes mellitus: Secondary | ICD-10-CM

## 2024-07-18 DIAGNOSIS — Z8759 Personal history of other complications of pregnancy, childbirth and the puerperium: Secondary | ICD-10-CM | POA: Insufficient documentation

## 2024-07-18 DIAGNOSIS — O99824 Streptococcus B carrier state complicating childbirth: Secondary | ICD-10-CM | POA: Diagnosis present

## 2024-07-18 DIAGNOSIS — Z30017 Encounter for initial prescription of implantable subdermal contraceptive: Secondary | ICD-10-CM | POA: Diagnosis not present

## 2024-07-18 DIAGNOSIS — Z7982 Long term (current) use of aspirin: Secondary | ICD-10-CM | POA: Diagnosis not present

## 2024-07-18 DIAGNOSIS — O26893 Other specified pregnancy related conditions, third trimester: Secondary | ICD-10-CM | POA: Diagnosis present

## 2024-07-18 DIAGNOSIS — O24420 Gestational diabetes mellitus in childbirth, diet controlled: Secondary | ICD-10-CM | POA: Diagnosis present

## 2024-07-18 DIAGNOSIS — Z6791 Unspecified blood type, Rh negative: Secondary | ICD-10-CM

## 2024-07-18 DIAGNOSIS — F1290 Cannabis use, unspecified, uncomplicated: Secondary | ICD-10-CM | POA: Diagnosis present

## 2024-07-18 DIAGNOSIS — O24410 Gestational diabetes mellitus in pregnancy, diet controlled: Principal | ICD-10-CM | POA: Diagnosis present

## 2024-07-18 DIAGNOSIS — O99324 Drug use complicating childbirth: Secondary | ICD-10-CM | POA: Diagnosis present

## 2024-07-18 DIAGNOSIS — Z8249 Family history of ischemic heart disease and other diseases of the circulatory system: Secondary | ICD-10-CM

## 2024-07-18 DIAGNOSIS — O1414 Severe pre-eclampsia complicating childbirth: Secondary | ICD-10-CM | POA: Diagnosis not present

## 2024-07-18 DIAGNOSIS — O134 Gestational [pregnancy-induced] hypertension without significant proteinuria, complicating childbirth: Secondary | ICD-10-CM | POA: Diagnosis present

## 2024-07-18 DIAGNOSIS — Z3A39 39 weeks gestation of pregnancy: Secondary | ICD-10-CM | POA: Diagnosis not present

## 2024-07-18 DIAGNOSIS — O99820 Streptococcus B carrier state complicating pregnancy: Secondary | ICD-10-CM | POA: Diagnosis not present

## 2024-07-18 DIAGNOSIS — Z87891 Personal history of nicotine dependence: Secondary | ICD-10-CM

## 2024-07-18 DIAGNOSIS — Z8632 Personal history of gestational diabetes: Secondary | ICD-10-CM | POA: Diagnosis present

## 2024-07-18 DIAGNOSIS — O139 Gestational [pregnancy-induced] hypertension without significant proteinuria, unspecified trimester: Secondary | ICD-10-CM | POA: Insufficient documentation

## 2024-07-18 DIAGNOSIS — O0993 Supervision of high risk pregnancy, unspecified, third trimester: Secondary | ICD-10-CM

## 2024-07-18 LAB — COMPREHENSIVE METABOLIC PANEL WITH GFR
ALT: 11 U/L (ref 0–44)
AST: 17 U/L (ref 15–41)
Albumin: 2.8 g/dL — ABNORMAL LOW (ref 3.5–5.0)
Alkaline Phosphatase: 125 U/L (ref 38–126)
Anion gap: 10 (ref 5–15)
BUN: <5 mg/dL — ABNORMAL LOW (ref 6–20)
CO2: 21 mmol/L — ABNORMAL LOW (ref 22–32)
Calcium: 8.4 mg/dL — ABNORMAL LOW (ref 8.9–10.3)
Chloride: 105 mmol/L (ref 98–111)
Creatinine, Ser: 0.63 mg/dL (ref 0.44–1.00)
GFR, Estimated: >60 mL/min (ref >60)
Glucose, Bld: 92 mg/dL (ref 70–99)
Potassium: 3 mmol/L — ABNORMAL LOW (ref 3.5–5.1)
Sodium: 136 mmol/L (ref 135–145)
Total Bilirubin: 0.5 mg/dL (ref 0.0–1.2)
Total Protein: 5.7 g/dL — ABNORMAL LOW (ref 6.5–8.1)

## 2024-07-18 LAB — TYPE AND SCREEN
ABO/RH(D): O NEG
Antibody Screen: POSITIVE

## 2024-07-18 LAB — CBC
HCT: 28.3 % — ABNORMAL LOW (ref 36.0–46.0)
Hemoglobin: 9.8 g/dL — ABNORMAL LOW (ref 12.0–15.0)
MCH: 32.2 pg (ref 26.0–34.0)
MCHC: 34.6 g/dL (ref 30.0–36.0)
MCV: 93.1 fL (ref 80.0–100.0)
Platelets: 212 K/uL (ref 150–400)
RBC: 3.04 MIL/uL — ABNORMAL LOW (ref 3.87–5.11)
RDW: 13 % (ref 11.5–15.5)
WBC: 11.4 K/uL — ABNORMAL HIGH (ref 4.0–10.5)
nRBC: 0 % (ref 0.0–0.2)

## 2024-07-18 LAB — RPR: RPR Ser Ql: NONREACTIVE

## 2024-07-18 LAB — GLUCOSE, CAPILLARY
Glucose-Capillary: 78 mg/dL (ref 70–99)
Glucose-Capillary: 78 mg/dL (ref 70–99)
Glucose-Capillary: 110 mg/dL — ABNORMAL HIGH (ref 70–99)

## 2024-07-18 MED ORDER — ACETAMINOPHEN 325 MG PO TABS
650.0000 mg | ORAL_TABLET | ORAL | Status: DC | PRN
Start: 2024-07-18 — End: 2024-07-20

## 2024-07-18 MED ORDER — TERBUTALINE SULFATE 1 MG/ML IJ SOLN: 0.2500 mg | Freq: Once | INTRAMUSCULAR | Status: DC | PRN

## 2024-07-18 MED ORDER — LACTATED RINGERS IV SOLN
500.0000 mL | Freq: Once | INTRAVENOUS | Status: AC
  Administered 2024-07-19: 500 mL via INTRAVENOUS

## 2024-07-18 MED ORDER — DIPHENHYDRAMINE HCL 50 MG/ML IJ SOLN: 12.5000 mg | INTRAMUSCULAR | Status: DC | PRN

## 2024-07-18 MED ORDER — PHENYLEPHRINE 80 MCG/ML (10ML) SYRINGE FOR IV PUSH (FOR BLOOD PRESSURE SUPPORT): 80.0000 ug | PREFILLED_SYRINGE | INTRAVENOUS | Status: DC | PRN

## 2024-07-18 MED ORDER — EPHEDRINE 5 MG/ML INJ: 10.0000 mg | INTRAVENOUS | Status: DC | PRN

## 2024-07-18 MED ORDER — FENTANYL-BUPIVACAINE-NACL 0.5-0.125-0.9 MG/250ML-% EP SOLN
12.0000 mL/h | EPIDURAL | Status: DC | PRN
  Filled 2024-07-18: qty 250

## 2024-07-18 MED ORDER — MISOPROSTOL 50MCG HALF TABLET
50.0000 ug | ORAL_TABLET | Freq: Once | ORAL | Status: AC
  Administered 2024-07-18: 50 ug via ORAL
  Filled 2024-07-18: qty 1

## 2024-07-18 MED ORDER — OXYCODONE-ACETAMINOPHEN 5-325 MG PO TABS: 1.0000 | ORAL_TABLET | ORAL | Status: DC | PRN

## 2024-07-18 MED ORDER — SOD CITRATE-CITRIC ACID 500-334 MG/5ML PO SOLN
30.0000 mL | ORAL | Status: DC | PRN
  Administered 2024-07-18: 30 mL via ORAL
  Filled 2024-07-18: qty 30

## 2024-07-18 MED ORDER — MISOPROSTOL 50MCG HALF TABLET
50.0000 ug | ORAL_TABLET | ORAL | Status: DC | PRN
  Administered 2024-07-18 (×2): 50 ug via ORAL
  Filled 2024-07-18 (×2): qty 1

## 2024-07-18 MED ORDER — LIDOCAINE HCL (PF) 1 % IJ SOLN: 30.0000 mL | INTRAMUSCULAR | Status: DC | PRN

## 2024-07-18 MED ORDER — OXYTOCIN BOLUS FROM INFUSION: 333.0000 mL | Freq: Once | INTRAVENOUS | Status: DC

## 2024-07-18 MED ORDER — LACTATED RINGERS IV SOLN: INTRAVENOUS | Status: DC

## 2024-07-18 MED ORDER — ONDANSETRON HCL 4 MG/2ML IJ SOLN
4.0000 mg | Freq: Four times a day (QID) | INTRAMUSCULAR | Status: DC | PRN
  Administered 2024-07-18 – 2024-07-19 (×2): 4 mg via INTRAVENOUS
  Filled 2024-07-18 (×2): qty 2

## 2024-07-18 MED ORDER — FAMOTIDINE IN NACL 20-0.9 MG/50ML-% IV SOLN
20.0000 mg | Freq: Two times a day (BID) | INTRAVENOUS | Status: DC
  Administered 2024-07-18 – 2024-07-19 (×3): 20 mg via INTRAVENOUS
  Filled 2024-07-18 (×3): qty 50

## 2024-07-18 MED ORDER — LACTATED RINGERS IV SOLN
500.0000 mL | INTRAVENOUS | Status: DC | PRN
  Administered 2024-07-18: 500 mL via INTRAVENOUS

## 2024-07-18 MED ORDER — CEFAZOLIN SODIUM-DEXTROSE 2-4 GM/100ML-% IV SOLN
2.0000 g | Freq: Once | INTRAVENOUS | Status: AC
  Administered 2024-07-18: 2 g via INTRAVENOUS
  Filled 2024-07-18: qty 100

## 2024-07-18 MED ORDER — FENTANYL CITRATE (PF) 100 MCG/2ML IJ SOLN
100.0000 ug | INTRAMUSCULAR | Status: DC | PRN
  Administered 2024-07-18 – 2024-07-19 (×4): 100 ug via INTRAVENOUS
  Filled 2024-07-18 (×4): qty 2

## 2024-07-18 MED ORDER — FLEET ENEMA RE ENEM: 1.0000 | ENEMA | RECTAL | Status: DC | PRN

## 2024-07-18 MED ORDER — OXYCODONE-ACETAMINOPHEN 5-325 MG PO TABS: 2.0000 | ORAL_TABLET | ORAL | Status: DC | PRN

## 2024-07-18 MED ORDER — OXYTOCIN-SODIUM CHLORIDE 30-0.9 UT/500ML-% IV SOLN: 2.5000 [IU]/h | INTRAVENOUS | Status: DC

## 2024-07-18 MED ORDER — HYDROXYZINE HCL 50 MG PO TABS: 50.0000 mg | ORAL_TABLET | Freq: Four times a day (QID) | ORAL | Status: DC | PRN

## 2024-07-18 MED ORDER — CEFAZOLIN SODIUM-DEXTROSE 1-4 GM/50ML-% IV SOLN
1.0000 g | Freq: Three times a day (TID) | INTRAVENOUS | Status: DC
  Administered 2024-07-18 – 2024-07-19 (×3): 1 g via INTRAVENOUS
  Filled 2024-07-18 (×4): qty 50

## 2024-07-18 MED ORDER — MISOPROSTOL 25 MCG QUARTER TABLET
25.0000 ug | ORAL_TABLET | Freq: Once | ORAL | Status: AC
  Administered 2024-07-18: 25 ug via VAGINAL
  Filled 2024-07-18: qty 1

## 2024-07-18 NOTE — H&P (Signed)
 OBSTETRIC ADMISSION HISTORY AND PHYSICAL  Pamela [name] is a 28 y.o. female G1P0000 with IUP at [redacted]w[redacted]d (dated by 7w US , Estimated Date of Delivery: 07/24/24) presenting for IOL due to A1GDM.   She reports +FMs, No LOF, no VB, no blurry vision, headaches or peripheral edema, and RUQ pain.    She plans on breast feeding. She request nexplanon  for birth control.  She received her prenatal [facility] at [facility]   Prenatal History/Complications:   -A1GDM  Past Medical History: Past Medical History:  Diagnosis Date   BV (bacterial vaginosis) 01/28/2015   Dysmenorrhea 10/14/2014   History of chlamydia 09/22/2015   History of UTI 09/22/2015   Ovarian cyst 08/03/2017   UTI (lower urinary tract infection) 08/21/2015    Past Surgical History: Past Surgical History:  Procedure Laterality Date   TONSILLECTOMY AND ADENOIDECTOMY     WISDOM TOOTH EXTRACTION      Obstetrical History: OB History     Gravida  1   Para  0   Term  0   Preterm  0   AB  0   Living  0      SAB  0   IAB  0   Ectopic  0   Multiple  0   Live Births              Social History Social History   Socioeconomic History   Marital status: Single    Spouse name: Not on file   Number of children: Not on file   Years of education: Not on file   Highest education level: Not on file  Occupational History   Not on file  Tobacco Use   Smoking status: Former    Current packs/day: 0.50    Average packs/day: 0.5 packs/day for 4.0 years (2.0 ttl pk-yrs)    Types: Cigarettes   Smokeless tobacco: Never   Tobacco comments:    smokes 10 cig daily  Vaping Use   Vaping status: Every Day  Substance and Sexual Activity   Alcohol use: No   Drug use: No   Sexual activity: Yes    Birth control/protection: None  Other Topics Concern   Not on file  Social History Narrative   Not on file   Social Drivers of Health   Financial Resource Strain: Low Risk  (06/13/2024)   Received from [facility]    Overall Financial Resource Strain (CARDIA)    How hard is it for you to pay for the very basics like food, housing, medical care, and heating?: Not hard at all  Food Insecurity: No Food Insecurity (07/18/2024)   Hunger Vital Sign    Worried About Running Out of Food in the Last Year: Never true    Ran Out of Food in the Last Year: Never true  Transportation Needs: No Transportation Needs (07/18/2024)   PRAPARE - Administrator, Civil Service (Medical): No    Lack of Transportation (Non-Medical): No  Physical Activity: Sufficiently Active (01/10/2024)   Exercise Vital Sign    Days of Exercise per Week: 4 days    Minutes of Exercise per Session: 80 min  Stress: Stress Concern Present (01/10/2024)   [employer] of Occupational Health - Occupational Stress Questionnaire    Feeling of Stress : To some extent  Social Connections: Unknown (01/10/2024)   Social Connection and Isolation Panel    Frequency of Communication with Friends and Family: More than three times a week  Frequency of Social Gatherings with Friends and Family: Three times a week    Attends Religious Services: Never    Active Member of Clubs or Organizations: No    Attends Banker Meetings: Never    Marital Status: Patient declined    Family History: Family History  Problem Relation Age of Onset   Colitis Mother    Neuropathy Mother    Cancer Maternal Grandmother        rectal   Diabetes Paternal Grandmother    Hypertension Paternal Grandmother    COPD Paternal Grandfather    Hypertension Paternal Grandfather     Allergies: Allergies  Allergen Reactions   Penicillins Other (See Comments)    Don't respond to PCN.     Medications Prior to Admission  Medication Sig Dispense Refill Last Dose/Taking   aspirin  81 MG chewable tablet Chew 2 tablets (162 mg total) by mouth daily. 60 tablet 7 07/17/2024   cephALEXin  (KEFLEX ) 500 MG capsule Take 500 mg by mouth 4 (four) times daily.    07/18/2024 Morning   ferrous sulfate  325 (65 FE) MG tablet Take 1 tablet (325 mg total) by mouth every other day. 45 tablet 2 07/17/2024   ondansetron  (ZOFRAN -ODT) 4 MG disintegrating tablet Take 1 tablet (4 mg total) by mouth every 8 (eight) hours as needed for nausea or vomiting. 30 tablet 1 07/18/2024 Morning   Prenatal Vit-Fe Fumarate-FA (PRENATAL VITAMIN PO) Take by mouth.   07/17/2024   Accu-Chek Softclix Lancets lancets Use as instructed to check blood sugar 4 times daily 100 each 12    Blood Glucose Monitoring Suppl (ACCU-CHEK GUIDE ME) w/Device KIT 1 each by Does not apply route 4 (four) times daily. 1 kit 0    Blood Pressure Monitor MISC For regular home bp monitoring during pregnancy 1 each 0    glucose blood test strip Use as instructed to check blood sugar four times daily 100 each 12    nitrofurantoin , macrocrystal-monohydrate, (MACROBID ) 100 MG capsule Take 1 capsule (100 mg total) by mouth 2 (two) times daily. (Patient not taking: Reported on 07/10/2024) 14 capsule 0      Review of Systems  All systems reviewed and negative except as stated in HPI.  Blood pressure (!) 142/80, pulse 60, temperature 98.3 F (36.8 C), temperature source Oral, resp. rate 16, height 5' 3 (1.6 m), weight 82.6 kg, last menstrual period 10/31/2023, SpO2 98%. General appearance: alert, cooperative, and appears stated age Lungs: breathing comfortably on room air Heart: regular rate Abdomen: soft, non-tender; gravid Extremities: no edema of bilateral lower extremities DTR's intact Presentation: cephalic Fetal monitoringBaseline: 120 bpm, Variability: Good {> 6 bpm), Accelerations: Reactive, and Decelerations: Absent Uterine activityFrequency: None  Dilation: Fingertip Effacement (%): Thick Station: -3 Exam by:: Pamela Bologna RN   Prenatal labs: ABO, Rh: --/--/O NEG (08/28 9094) Antibody: POS (08/28 9094) Rubella: 7.03 (07/09 0804) RPR: NON-REACTIVE (07/09 0804)  HBsAg: NON-REACTIVE (07/09  0804)  HIV: NON-REACTIVE (07/09 0804)  GBS: --Pamela [name] (08/13 1458)  2 hr Glucola abnormal Genetic screening  NIPS: LR female, AFP: neg Anatomy US  normal Last US : At [redacted]w[redacted]d - cephalic presentation, EFW 2704 (45 %tile),   Prenatal Transfer Tool  Maternal Diabetes: Yes:  Diabetes Type:  Diet controlled Genetic Screening: Normal Maternal Ultrasounds/Referrals: Normal Fetal Ultrasounds or other Referrals:  None Maternal Substance Abuse:  No Significant Maternal Medications:  None Significant Maternal Lab Results:  Group B Strep positive and Rh negative Number of Prenatal Visits:greater than 3 verified prenatal visits  Other Comments:  None  Results for orders placed or performed during the hospital encounter of 07/18/24 (from the past 24 hours)  Type and screen   Collection Time: 07/18/24  9:05 AM  Result Value Ref Range   ABO/RH(D) O NEG    Antibody Screen POS    Sample Expiration 07/21/2024,2359    Antibody Identification      PASSIVELY ACQUIRED ANTI-D Performed at [facility], [address], [place], [place] [address]   CBC   Collection Time: 07/18/24  9:08 AM  Result Value Ref Range   WBC 11.4 (H) 4.0 - 10.5 K/uL   RBC 3.04 (L) 3.87 - 5.11 MIL/uL   Hemoglobin 9.8 (L) 12.0 - 15.0 g/dL   HCT 71.6 (L) 63.9 - 53.9 %   MCV 93.1 80.0 - 100.0 fL   MCH 32.2 26.0 - 34.0 pg   MCHC 34.6 30.0 - 36.0 g/dL   RDW 86.9 88.4 - 84.4 %   Platelets 212 150 - 400 K/uL   nRBC 0.0 0.0 - 0.2 %  Glucose, capillary   Collection Time: 07/18/24  9:47 AM  Result Value Ref Range   Glucose-Capillary 78 70 - 99 mg/dL    Patient Active Problem List   Diagnosis Date Noted   Gestational diabetes mellitus, class A1 07/18/2024   Gestational diabetes 05/30/2024   GBS bacteriuria 01/17/2024   Supervision of high risk pregnancy, antepartum 01/10/2024   Trichomoniasis 12/12/2023   Rh negative state in antepartum period 12/07/2023   History of ovarian cyst 08/13/2018   Depression 08/05/2016    History of chlamydia 09/22/2015    Assessment/Plan:  Pamela [name] is a 28 y.o. G1P0000 at [redacted]w[redacted]d here for IOL due to gHTN.  #Labor:Initiate IOL with dual cytotec . Discussed other augmentation measures.  #Pain: Per patient request #FWB: Cat I #ID:  GBS (+), ancef  #MOF: Breast #MOC: Nexplanon  #Circ:  N/A  Pamela Angles, MD OB Fellow, Faculty Practice [facility], Center for [facility] 07/18/2024 12:42 PM

## 2024-07-18 NOTE — Progress Notes (Signed)
 Patient Vitals for the past 4 hrs:  BP Temp Temp src Pulse Resp  07/18/24 2120 (!) 143/75 -- -- (!) 58 --  07/18/24 2118 -- 97.7 F (36.5 C) Oral -- 20  07/18/24 1847 137/85 -- -- 63 16   Doing well w/IV pain meds.  Foley still in, cx feels ~ 2cms. FHR 120s, Cat 1. Ctx have spaced out.  Will give another oral cytotec 

## 2024-07-18 NOTE — Progress Notes (Signed)
 Patient Vitals for the past 4 hrs:  BP Pulse Resp  07/18/24 1847 137/85 63 16  07/18/24 1815 (!) 145/83 60 16  07/18/24 1614 (!) 143/82 (!) 57 18   Meets criteria for GHTN.  Will check PreE labs  Ctx q 1-2 minutes, mild. FHR Cat 1. Cx 1.5/50/-2.  Foley inserted and inflated w/40cc H20.  Will try to add up to 20cc more if pt tolerates. Plan cytotec  if ctx space out.

## 2024-07-18 NOTE — Progress Notes (Addendum)
 Labor progress note:  At bedside to assess for FB placement. EFM: 130/mod/+a/-d, Toco every 2-3 min. Cx FT, 50%. Pt amenable to [facility] placement. [place] attempted x2, patient very uncomfortable, so attempt halted. Cx 1cm on recheck. Pt open to another attempt in a bit. Will give repeat dose Cytotec  when due.  CBGs ok.  Alain Sor, MD OB Fellow, Faculty Practice [name], Center for [facility]

## 2024-07-19 ENCOUNTER — Inpatient Hospital Stay (HOSPITAL_COMMUNITY): Admitting: Anesthesiology

## 2024-07-19 DIAGNOSIS — O1414 Severe pre-eclampsia complicating childbirth: Secondary | ICD-10-CM | POA: Diagnosis not present

## 2024-07-19 DIAGNOSIS — Z3A39 39 weeks gestation of pregnancy: Secondary | ICD-10-CM

## 2024-07-19 DIAGNOSIS — O24420 Gestational diabetes mellitus in childbirth, diet controlled: Secondary | ICD-10-CM | POA: Diagnosis not present

## 2024-07-19 DIAGNOSIS — O99820 Streptococcus B carrier state complicating pregnancy: Secondary | ICD-10-CM | POA: Diagnosis not present

## 2024-07-19 LAB — CBC
HCT: 31.4 % — ABNORMAL LOW (ref 36.0–46.0)
HCT: 32.8 % — ABNORMAL LOW (ref 36.0–46.0)
Hemoglobin: 10.6 g/dL — ABNORMAL LOW (ref 12.0–15.0)
Hemoglobin: 10.9 g/dL — ABNORMAL LOW (ref 12.0–15.0)
MCH: 31.9 pg (ref 26.0–34.0)
MCH: 31.6 pg (ref 26.0–34.0)
MCHC: 33.8 g/dL (ref 30.0–36.0)
MCHC: 33.2 g/dL (ref 30.0–36.0)
MCV: 94.6 fL (ref 80.0–100.0)
MCV: 95.1 fL (ref 80.0–100.0)
Platelets: 201 K/uL (ref 150–400)
Platelets: 196 K/uL (ref 150–400)
RBC: 3.32 MIL/uL — ABNORMAL LOW (ref 3.87–5.11)
RBC: 3.45 MIL/uL — ABNORMAL LOW (ref 3.87–5.11)
RDW: 13 % (ref 11.5–15.5)
RDW: 13 % (ref 11.5–15.5)
WBC: 16 K/uL — ABNORMAL HIGH (ref 4.0–10.5)
WBC: 19.4 K/uL — ABNORMAL HIGH (ref 4.0–10.5)
nRBC: 0 % (ref 0.0–0.2)
nRBC: 0 % (ref 0.0–0.2)

## 2024-07-19 LAB — COMPREHENSIVE METABOLIC PANEL WITH GFR
ALT: 12 U/L (ref 0–44)
AST: 19 U/L (ref 15–41)
Albumin: 2.9 g/dL — ABNORMAL LOW (ref 3.5–5.0)
Alkaline Phosphatase: 139 U/L — ABNORMAL HIGH (ref 38–126)
Anion gap: 12 (ref 5–15)
BUN: <5 mg/dL — ABNORMAL LOW (ref 6–20)
CO2: 21 mmol/L — ABNORMAL LOW (ref 22–32)
Calcium: 8.7 mg/dL — ABNORMAL LOW (ref 8.9–10.3)
Chloride: 104 mmol/L (ref 98–111)
Creatinine, Ser: 0.7 mg/dL (ref 0.44–1.00)
GFR, Estimated: >60 mL/min (ref >60)
Glucose, Bld: 77 mg/dL (ref 70–99)
Potassium: 3.2 mmol/L — ABNORMAL LOW (ref 3.5–5.1)
Sodium: 137 mmol/L (ref 135–145)
Total Bilirubin: 0.9 mg/dL (ref 0.0–1.2)
Total Protein: 5.8 g/dL — ABNORMAL LOW (ref 6.5–8.1)

## 2024-07-19 LAB — GLUCOSE, CAPILLARY
Glucose-Capillary: 100 mg/dL — ABNORMAL HIGH (ref 70–99)
Glucose-Capillary: 81 mg/dL (ref 70–99)
Glucose-Capillary: 74 mg/dL (ref 70–99)
Glucose-Capillary: 70 mg/dL (ref 70–99)

## 2024-07-19 LAB — PROTEIN / CREATININE RATIO, URINE
Creatinine, Urine: 80 mg/dL
Protein Creatinine Ratio: 0.34 mg/mg{creat} — ABNORMAL HIGH (ref 0.00–0.15)
Total Protein, Urine: 27 mg/dL

## 2024-07-19 MED ORDER — MAGNESIUM SULFATE 40 GM/1000ML IV SOLN
2.0000 g/h | INTRAVENOUS | Status: AC
  Filled 2024-07-19: qty 1000

## 2024-07-19 MED ORDER — OXYTOCIN-SODIUM CHLORIDE 30-0.9 UT/500ML-% IV SOLN
1.0000 m[IU]/min | INTRAVENOUS | Status: DC
  Administered 2024-07-19: 2 m[IU]/min via INTRAVENOUS
  Filled 2024-07-19: qty 500

## 2024-07-19 MED ORDER — MAGNESIUM SULFATE BOLUS VIA INFUSION
4.0000 g | Freq: Once | INTRAVENOUS | Status: AC
  Administered 2024-07-19: 4 g via INTRAVENOUS
  Filled 2024-07-19: qty 1000

## 2024-07-19 MED ORDER — LIDOCAINE HCL (PF) 1 % IJ SOLN
INTRAMUSCULAR | Status: DC | PRN
  Administered 2024-07-19: 5 mL via EPIDURAL

## 2024-07-19 MED ORDER — LACTATED RINGERS IV SOLN
INTRAVENOUS | Status: DC
Start: 2024-07-19 — End: 2024-07-20

## 2024-07-19 MED ORDER — FENTANYL-BUPIVACAINE-NACL 0.5-0.125-0.9 MG/250ML-% EP SOLN
EPIDURAL | Status: DC | PRN
  Administered 2024-07-19: 12 mL/h via EPIDURAL

## 2024-07-19 NOTE — Progress Notes (Signed)
 Pamela [name] is a 28 y.o. G1P0000 at [redacted]w[redacted]d admitted for IOL for A1GDM  Subjective:   Objective: BP (!) 142/78   Pulse 68   Temp 98.8 F (37.1 C) (Axillary)   Resp 18   Ht 5' 3 (1.6 m)   Wt 82.6 kg   LMP 10/31/2023   SpO2 95%   BMI 32.26 kg/m  I/O last 3 completed shifts: In: 1888.5 [I.V.:1738.5; IV Piggyback:150] Out: -  Total I/O In: 644.9 [P.O.:80; I.V.:464.9; IV Piggyback:100] Out: 950 [Urine:950]  FHT:  FHR: 135 bpm, variability: moderate,  accelerations:  Present,  decelerations:  Absent UC:   regular, every 2-3 minutes SVE:   Dilation: Lip/rim Effacement (%): 90 Station: 0, Plus 1 Exam by:: [employer] RN  Labs: Lab Results  Component Value Date   WBC 19.4 (H) 07/19/2024   HGB 10.9 (L) 07/19/2024   HCT 32.8 (L) 07/19/2024   MCV 95.1 07/19/2024   PLT 196 07/19/2024    Assessment / Plan: New onset GHTN vs preeclampsia with severe range blood pressures IOL for A1GDM  Labor: Pt with anterior lip, pushed x 2 contractions, lip not reducible. Will reposition, check again in 1 hour.  Preeclampsia:  Repeat Preeclampsia labs now Fetal Wellbeing:  Category I Pain Control:  Epidural I/D:  GBS positive, Ancef  Anticipated MOD:  NSVD  Olam Boards, CNM 07/19/2024, 6:43 PM

## 2024-07-19 NOTE — Progress Notes (Signed)
 Patient Vitals for the past 4 hrs:  BP Temp Temp src Pulse Resp  07/19/24 0152 (!) 145/93 98 F (36.7 C) Oral 67 18  07/18/24 2332 135/75 98 F (36.7 C) Oral (!) 59 16  07/18/24 2213 129/78 -- -- 61 --   Ctx are about the same intensity, q 2-4 minutes.  FHR Cat 1.  Foley out, cx 4.5/70/-2.  AROM w/clear fluid.  Will start pitocin  at 0230 (4 hours after last cytotec ).  Pt plans epidural when ctx increase in frequency and intensity.

## 2024-07-19 NOTE — Discharge Summary (Signed)
 Postpartum Discharge Summary       Patient Name: Pamela [name] DOB: 1996-04-20 MRN: [id number]  Date of admission: 07/18/2024 Delivery date:07/19/2024 Delivering provider: MILLY PLANAS A Date of discharge: 07/21/2024  Admitting diagnosis: Gestational diabetes mellitus, class A1 [O24.410] Intrauterine pregnancy: [redacted]w[redacted]d     Secondary diagnosis:  Principal Problem:   Gestational diabetes mellitus, class A1 Active Problems:   Gestational hypertension   Vaginal delivery SIPE w/severe features Additional problems:     Discharge diagnosis:                                               Post partum procedures: Augmentation: AROM, Pitocin , Cytotec , and IP Foley Complications: None  Hospital course: Induction of Labor With Vaginal Delivery   28 y.o. yo G1P1001 at [redacted]w[redacted]d was admitted to the hospital 07/18/2024 for induction of labor.  Indication for induction: Gestational hypertension and A1 DM.  Patient had an labor course complicated by above diagnoses. Membrane Rupture Time/Date: 1:48 AM,07/19/2024  Delivery Method:Vaginal, Spontaneous Operative Delivery:N/A Episiotomy: None Lacerations:  1st degree;Perineal Details of delivery can be found in separate delivery note.  Patient had a postpartum course complicated by. Patient is discharged home 07/21/24.  Newborn Data: Birth date:07/19/2024 Birth time:7:59 PM Gender:Female Living status:Living Apgars:9 ,9  Weight:3230 g  Magnesium  Sulfate received: No BMZ received: No Rhophylac :Yes MMR:N/A T-DaP:Given prenatally Flu: No RSV Vaccine received: No Transfusion:No  Immunizations received: Immunization History  Administered Date(s) Administered   DTaP 08/25/1997, 12/08/1997, 03/19/1998, 01/28/1999, 07/11/2002   HIB (PRP-OMP) 08/25/1997, 12/08/1997, 01/28/1999   Hepatitis B 06/23/1997, 08/25/1997, 03/19/1998   Hpv-Unspecified 06/26/2009, 08/27/2009, 01/04/2010   IPV 08/25/1997, 12/08/1997, 01/28/1999, 07/11/2002    Influenza-Unspecified 09/09/2008, 10/03/2014   MMR 06/18/1998, 07/11/2002   Rho (D) Immune Globulin  12/07/2023, 05/15/2024   Tdap 06/26/2009, 05/15/2024   Varicella 06/18/1998    Physical exam  Vitals:   07/21/24 0118 07/21/24 0546 07/21/24 0742 07/21/24 1506  BP:  126/73 133/85 137/84  Pulse:  67 70 71  Resp:  17 16 16   Temp: 98.1 F (36.7 C) 97.9 F (36.6 C) 98 F (36.7 C) 98.4 F (36.9 C)  TempSrc: Oral Oral Oral Oral  SpO2:  97% 99% 98%  Weight:      Height:       General: alert, cooperative, and no distress Lochia: appropriate Uterine Fundus: firm Incision: Healing well with no significant drainage DVT Evaluation: No evidence of DVT seen on physical exam. Labs: Lab Results  Component Value Date   WBC 18.9 (H) 07/20/2024   HGB 9.7 (L) 07/20/2024   HCT 28.7 (L) 07/20/2024   MCV 94.4 07/20/2024   PLT 176 07/20/2024      Latest Ref Rng & Units 07/20/2024    4:29 AM  CMP  Glucose 70 - 99 mg/dL 899    Edinburgh Score:    07/20/2024   11:07 AM  Edinburgh Postnatal Depression Scale Screening Tool  I have been able to laugh and see the funny side of things. 0  I have looked forward with enjoyment to things. 0  I have blamed myself unnecessarily when things went wrong. 1  I have been anxious or worried for no good reason. 0  I have felt scared or panicky for no good reason. 0  Things have been getting on top of me. 0  I have been so unhappy that  I have had difficulty sleeping. 0  I have felt sad or miserable. 0  I have been so unhappy that I have been crying. 0  The thought of harming myself has occurred to me. 0  Edinburgh Postnatal Depression Scale Total 1   Edinburgh Postnatal Depression Scale Total: 1   After visit meds:  Allergies as of 07/21/2024       Reactions   Penicillins Other (See Comments)   Don't respond to PCN.         Medication List     STOP taking these medications    Accu-Chek Guide Me w/Device Kit   Accu-Chek Softclix Lancets  lancets   aspirin  81 MG chewable tablet   Blood Pressure Monitor Misc   cephALEXin  500 MG capsule Commonly known as: KEFLEX    ferrous sulfate  325 (65 FE) MG tablet   glucose blood test strip   nitrofurantoin  (macrocrystal-monohydrate) 100 MG capsule Commonly known as: MACROBID    ondansetron  4 MG disintegrating tablet Commonly known as: ZOFRAN -ODT   PRENATAL VITAMIN PO       TAKE these medications    furosemide  20 MG tablet Commonly known as: LASIX  Take 1 tablet (20 mg total) by mouth 2 (two) times daily.   ibuprofen  600 MG tablet Commonly known as: ADVIL  Take 1 tablet (600 mg total) by mouth every 6 (six) hours.   NIFEdipine  30 MG 24 hr tablet Commonly known as: ADALAT  CC Take 1 tablet (30 mg total) by mouth 2 (two) times daily.   potassium chloride  SA 20 MEQ tablet Commonly known as: KLOR-CON  M Take 1 tablet (20 mEq total) by mouth 2 (two) times daily.         Discharge home in stable condition Infant Feeding: Breast Infant Disposition:home with mother Discharge instruction: per After Visit Summary and Postpartum booklet. Activity: Advance as tolerated. Pelvic rest for 6 weeks.  Diet: routine diet Future Appointments: Future Appointments  Date Time Provider Department Center  08/28/2024  8:30 AM [facility]  08/28/2024  9:30 AM Kizzie Suzen SAUNDERS, CNM [facility]   Follow up Visit:  Follow-up Information     [facility] for [facility] Follow up on 07/26/2024.   Specialty: Obstetrics and Gynecology Why: MyChart Connect visit: BP check with nurse Contact information: [address] [place]  [address] [phone]              Previously scheduled as above. No message sent.     07/21/2024 Pamela VEAR Inch, MD

## 2024-07-19 NOTE — Plan of Care (Signed)

## 2024-07-19 NOTE — Progress Notes (Signed)
 Pamela [name] is a 28 y.o. G1P0000 at [redacted]w[redacted]d admitted for IOL for A1GDM  Subjective: Pt comfortable with epidural. Family in room for support.  Objective: BP 127/70   Pulse (!) 59   Temp 98 F (36.7 C) (Oral)   Resp 20   Ht 5' 3 (1.6 m)   Wt 82.6 kg   LMP 10/31/2023   SpO2 98%   BMI 32.26 kg/m  I/O last 3 completed shifts: In: 1888.5 [I.V.:1738.5; IV Piggyback:150] Out: -  Total I/O In: -  Out: 550 [Urine:550]  FHT:  FHR: 135 bpm, variability: moderate,  accelerations:  Present,  decelerations:  Absent UC:   regular, every 2-3 minutes SVE:   Dilation: 7 Effacement (%): 100 Station: -2 Exam by:: Olam Boards, CNM  Labs: Lab Results  Component Value Date   WBC 16.0 (H) 07/19/2024   HGB 10.6 (L) 07/19/2024   HCT 31.4 (L) 07/19/2024   MCV 94.6 07/19/2024   PLT 201 07/19/2024    Assessment / Plan: Induction of labor due to A1 GDM S/P foley balloon, AROM Pitocin   Labor: Progressing normally Preeclampsia:  n/a Fetal Wellbeing:  Category I Pain Control:  Epidural I/D:  GBS positive, on Ancef  for PCN allergy Anticipated MOD:  NSVD  Olam Boards, CNM 07/19/2024, 2:14 PM

## 2024-07-19 NOTE — Anesthesia Procedure Notes (Signed)
 Epidural Patient location during procedure: OB Start time: 07/19/2024 8:08 AM End time: 07/19/2024 8:22 AM  Staffing Anesthesiologist: Jefm Garnette LABOR, MD Performed: anesthesiologist   Preanesthetic Checklist Completed: patient identified, IV checked, site marked, risks and benefits discussed, surgical consent, monitors and equipment checked, pre-op evaluation and timeout performed  Epidural Patient position: sitting Prep: DuraPrep and site prepped and draped Patient monitoring: continuous pulse ox and blood pressure Approach: midline Location: L3-L4 Injection technique: LOR air  Needle:  Needle type: Tuohy  Needle gauge: 17 G Needle length: 9 cm and 9 Needle insertion depth: 5 cm cm Catheter type: closed end flexible Catheter size: 19 Gauge Catheter at skin depth: 12 cm Test dose: negative  Assessment Events: blood not aspirated, no cerebrospinal fluid, injection not painful, no injection resistance, no paresthesia and negative IV test  Additional Notes Patient identified. Risks/Benefits/Options discussed with patient including but not limited to bleeding, infection, nerve damage, paralysis, failed block, incomplete pain control, headache, blood pressure changes, nausea, vomiting, reactions to medication both or allergic, itching and postpartum back pain. Confirmed with bedside nurse the patient's most recent platelet count. Confirmed with patient that they are not currently taking any anticoagulation, have any bleeding history or any family history of bleeding disorders. Patient expressed understanding and wished to proceed. All questions were answered. Sterile technique was used throughout the entire procedure. Please see nursing notes for vital signs. Test dose was given through epidural needle and negative prior to continuing to dose epidural or start infusion. Warning signs of high block given to the patient including shortness of breath, tingling/numbness in hands, complete  motor block, or any concerning symptoms with instructions to call for help. Patient was given instructions on fall risk and not to get out of bed. All questions and concerns addressed with instructions to call with any issues.  1 Attempt (S) . Patient tolerated procedure well.

## 2024-07-19 NOTE — Progress Notes (Signed)
 Patient Vitals for the past 4 hrs:  BP Temp Temp src Pulse Resp  07/19/24 0548 (!) 152/82 -- -- (!) 59 --  07/19/24 0434 139/83 98.2 F (36.8 C) Oral (!) 58 16   Pt declined pitocin  at 0230, wanted to see if AROM would bring labor on.  Cx 4.5/70/-2/posterior.  FHR Cat 1.  Wants to get epidural prior to starting pitocin  .

## 2024-07-19 NOTE — Anesthesia Preprocedure Evaluation (Addendum)
 Anesthesia Evaluation  Patient identified by MRN, date of birth, ID band Patient awake    Reviewed: Allergy & Precautions, NPO status , Patient's Chart, lab work & pertinent test results  Airway Mallampati: II  TM Distance: >3 FB Neck ROM: Full    Dental no notable dental hx. (+) Upper Dentures, Dental Advisory Given   Pulmonary former smoker   Pulmonary exam normal breath sounds clear to auscultation       Cardiovascular hypertension (gHtn), Pt. on medications Normal cardiovascular exam Rhythm:Regular Rate:Normal     Neuro/Psych  PSYCHIATRIC DISORDERS  Depression    negative neurological ROS     GI/Hepatic negative GI ROS, Neg liver ROS,,,  Endo/Other  diabetes, Gestational    Renal/GU negative Renal ROS     Musculoskeletal   Abdominal   Peds  Hematology Lab Results      Component                Value               Date                      WBC                      16.0 (H)            07/19/2024                HGB                      10.6 (L)            07/19/2024                HCT                      31.4 (L)            07/19/2024                MCV                      94.6                07/19/2024                PLT                      201                 07/19/2024              Anesthesia Other Findings All: PCN  Reproductive/Obstetrics (+) Pregnancy                              Anesthesia Physical Anesthesia Plan  ASA: 3  Anesthesia Plan: Epidural   Post-op Pain Management:    Induction:   PONV Risk Score and Plan:   Airway Management Planned:   Additional Equipment:   Intra-op Plan:   Post-operative Plan:   Informed Consent: I have reviewed the patients History and Physical, chart, labs and discussed the procedure including the risks, benefits and alternatives for the proposed anesthesia with the patient or authorized representative who has indicated his/her  understanding and acceptance.     Dental advisory given  Plan Discussed with: CRNA and Surgeon  Anesthesia Plan Comments: (39.2 wk Primagravida w gHtn and gDm for LEA)         Anesthesia Quick Evaluation

## 2024-07-20 ENCOUNTER — Encounter (HOSPITAL_COMMUNITY): Payer: Self-pay | Admitting: Obstetrics & Gynecology

## 2024-07-20 LAB — CBC
HCT: 28.7 % — ABNORMAL LOW (ref 36.0–46.0)
Hemoglobin: 9.7 g/dL — ABNORMAL LOW (ref 12.0–15.0)
MCH: 31.9 pg (ref 26.0–34.0)
MCHC: 33.8 g/dL (ref 30.0–36.0)
MCV: 94.4 fL (ref 80.0–100.0)
Platelets: 176 K/uL (ref 150–400)
RBC: 3.04 MIL/uL — ABNORMAL LOW (ref 3.87–5.11)
RDW: 13 % (ref 11.5–15.5)
WBC: 18.9 K/uL — ABNORMAL HIGH (ref 4.0–10.5)
nRBC: 0 % (ref 0.0–0.2)

## 2024-07-20 LAB — GLUCOSE, RANDOM: Glucose, Bld: 100 mg/dL — ABNORMAL HIGH (ref 70–99)

## 2024-07-20 MED ORDER — BENZOCAINE-MENTHOL 20-0.5 % EX AERO
1.0000 | INHALATION_SPRAY | CUTANEOUS | Status: DC | PRN
  Filled 2024-07-20: qty 56

## 2024-07-20 MED ORDER — PRENATAL MULTIVITAMIN CH
1.0000 | ORAL_TABLET | Freq: Every day | ORAL | Status: DC
  Administered 2024-07-20 – 2024-07-21 (×2): 1 via ORAL
  Filled 2024-07-20 (×2): qty 1

## 2024-07-20 MED ORDER — SENNOSIDES-DOCUSATE SODIUM 8.6-50 MG PO TABS
2.0000 | ORAL_TABLET | ORAL | Status: DC
  Administered 2024-07-20 – 2024-07-21 (×2): 2 via ORAL
  Filled 2024-07-20 (×2): qty 2

## 2024-07-20 MED ORDER — FUROSEMIDE 20 MG PO TABS
20.0000 mg | ORAL_TABLET | Freq: Two times a day (BID) | ORAL | Status: DC
  Administered 2024-07-20 – 2024-07-21 (×3): 20 mg via ORAL
  Filled 2024-07-20 (×3): qty 1

## 2024-07-20 MED ORDER — SIMETHICONE 80 MG PO CHEW: 80.0000 mg | CHEWABLE_TABLET | ORAL | Status: DC | PRN

## 2024-07-20 MED ORDER — ONDANSETRON HCL 4 MG/2ML IJ SOLN: 4.0000 mg | INTRAMUSCULAR | Status: DC | PRN

## 2024-07-20 MED ORDER — COCONUT OIL OIL: 1.0000 | TOPICAL_OIL | Status: DC | PRN

## 2024-07-20 MED ORDER — POTASSIUM CHLORIDE CRYS ER 20 MEQ PO TBCR
20.0000 meq | EXTENDED_RELEASE_TABLET | Freq: Two times a day (BID) | ORAL | Status: DC
  Administered 2024-07-20 – 2024-07-21 (×3): 20 meq via ORAL
  Filled 2024-07-20 (×3): qty 1

## 2024-07-20 MED ORDER — NIFEDIPINE ER OSMOTIC RELEASE 30 MG PO TB24
30.0000 mg | ORAL_TABLET | Freq: Every day | ORAL | Status: DC
  Administered 2024-07-20: 30 mg via ORAL
  Filled 2024-07-20: qty 1

## 2024-07-20 MED ORDER — WITCH HAZEL-GLYCERIN EX PADS: 1.0000 | MEDICATED_PAD | CUTANEOUS | Status: DC | PRN

## 2024-07-20 MED ORDER — ACETAMINOPHEN 325 MG PO TABS: 650.0000 mg | ORAL_TABLET | ORAL | Status: DC | PRN

## 2024-07-20 MED ORDER — ZOLPIDEM TARTRATE 5 MG PO TABS: 5.0000 mg | ORAL_TABLET | Freq: Every evening | ORAL | Status: DC | PRN

## 2024-07-20 MED ORDER — TETANUS-DIPHTH-ACELL PERTUSSIS 5-2.5-18.5 LF-MCG/0.5 IM SUSY: 0.5000 mL | PREFILLED_SYRINGE | Freq: Once | INTRAMUSCULAR | Status: DC

## 2024-07-20 MED ORDER — DIPHENHYDRAMINE HCL 25 MG PO CAPS: 25.0000 mg | ORAL_CAPSULE | Freq: Four times a day (QID) | ORAL | Status: DC | PRN

## 2024-07-20 MED ORDER — IBUPROFEN 600 MG PO TABS
600.0000 mg | ORAL_TABLET | Freq: Four times a day (QID) | ORAL | Status: DC
  Administered 2024-07-20 – 2024-07-21 (×6): 600 mg via ORAL
  Filled 2024-07-20 (×6): qty 1

## 2024-07-20 MED ORDER — ONDANSETRON HCL 4 MG PO TABS: 4.0000 mg | ORAL_TABLET | ORAL | Status: DC | PRN

## 2024-07-20 MED ORDER — RHO D IMMUNE GLOBULIN 1500 UNIT/2ML IJ SOSY
300.0000 ug | PREFILLED_SYRINGE | Freq: Once | INTRAMUSCULAR | Status: DC
  Filled 2024-07-20: qty 2

## 2024-07-20 MED ORDER — DIBUCAINE (PERIANAL) 1 % EX OINT: 1.0000 | TOPICAL_OINTMENT | CUTANEOUS | Status: DC | PRN

## 2024-07-20 NOTE — Plan of Care (Signed)

## 2024-07-20 NOTE — Anesthesia Postprocedure Evaluation (Signed)
 Anesthesia Post Note  Patient: Linnie Delgrande  Procedure(s) Performed: AN AD HOC LABOR EPIDURAL     Patient location during evaluation: Mother Baby Anesthesia Type: Epidural Level of consciousness: awake and alert Pain management: pain level controlled Vital Signs Assessment: post-procedure vital signs reviewed and stable Respiratory status: spontaneous breathing, nonlabored ventilation and respiratory function stable Cardiovascular status: stable Postop Assessment: no headache, no backache, epidural receding and able to ambulate Anesthetic complications: no   No notable events documented.  Last Vitals:  Vitals:   07/20/24 0228 07/20/24 0637  BP: 138/83 130/79  Pulse:  70  Resp: 18 16  Temp: 36.9 C 36.8 C  SpO2: 99% 100%    Last Pain:  Vitals:   07/20/24 0720  TempSrc:   PainSc: Asleep   Pain Goal: Patients Stated Pain Goal: 0 (07/19/24 1055)                 Nayana Lenig

## 2024-07-20 NOTE — Progress Notes (Signed)
 Post Partum Day 1 Subjective: no complaints, up ad lib, voiding, and tolerating PO  Objective: Blood pressure (!) 156/88, pulse 62, temperature 98.1 F (36.7 C), temperature source Oral, resp. rate 18, height 5' 3 (1.6 m), weight 82.6 kg, last menstrual period 10/31/2023, SpO2 99%, unknown if currently breastfeeding.  Physical Exam:  General: alert, cooperative, and no distress Lochia: appropriate Uterine Fundus: firm Incision:  DVT Evaluation: No evidence of DVT seen on physical exam.  Recent Labs    07/19/24 1646 07/20/24 0429  HGB 10.9* 9.7*  HCT 32.8* 28.7*    Assessment/Plan: Stop Mag at 2000 Begin lasix  + Kdur Evaluate for BP med when comes of magnesium    LOS: 2 days   Pamela VEAR Inch, MD 07/20/2024, 10:08 AM

## 2024-07-21 MED ORDER — NIFEDIPINE ER OSMOTIC RELEASE 30 MG PO TB24
30.0000 mg | ORAL_TABLET | Freq: Two times a day (BID) | ORAL | Status: DC
  Administered 2024-07-21: 30 mg via ORAL
  Filled 2024-07-21: qty 1

## 2024-07-21 MED ORDER — LIDOCAINE HCL 1 % IJ SOLN
0.0000 mL | Freq: Once | INTRAMUSCULAR | Status: AC | PRN
  Administered 2024-07-21: 20 mL via INTRADERMAL
  Filled 2024-07-21: qty 20

## 2024-07-21 MED ORDER — RHO D IMMUNE GLOBULIN 1500 UNIT/2ML IJ SOSY
300.0000 ug | PREFILLED_SYRINGE | Freq: Once | INTRAMUSCULAR | Status: AC
  Administered 2024-07-21: 300 ug via INTRAVENOUS
  Filled 2024-07-21: qty 2

## 2024-07-21 MED ORDER — POTASSIUM CHLORIDE CRYS ER 20 MEQ PO TBCR: 20.0000 meq | EXTENDED_RELEASE_TABLET | Freq: Two times a day (BID) | ORAL | 0 refills | Status: AC

## 2024-07-21 MED ORDER — FUROSEMIDE 20 MG PO TABS: 20.0000 mg | ORAL_TABLET | Freq: Two times a day (BID) | ORAL | 0 refills | Status: AC

## 2024-07-21 MED ORDER — IBUPROFEN 600 MG PO TABS: 600.0000 mg | ORAL_TABLET | Freq: Four times a day (QID) | ORAL | 0 refills | Status: AC

## 2024-07-21 MED ORDER — NIFEDIPINE ER 30 MG PO TB24: 30.0000 mg | ORAL_TABLET | Freq: Two times a day (BID) | ORAL | 1 refills | Status: AC

## 2024-07-21 MED ORDER — ETONOGESTREL 68 MG ~~LOC~~ IMPL
68.0000 mg | DRUG_IMPLANT | Freq: Once | SUBCUTANEOUS | Status: AC
  Administered 2024-07-21: 68 mg via SUBCUTANEOUS
  Filled 2024-07-21: qty 1

## 2024-07-21 NOTE — Progress Notes (Signed)
 Nexplanon   Insertion  Patient identified, informed consent performed, consent signed.   Patient does understand that irregular bleeding is a very common side effect of this medication. Patient here for postpartum care and not currently pregnant.  Appropriate time out taken. Nexplanon  site identified. Area prepped in usual sterile fashon. 3 ml of 1% lidocaine  with epinephrine was used to anesthetize the area at the distal end of the implant.   The insertion site was identified 8-10 cm (3-4 inches) from the medial epicondyle of the humerus and 3-5 cm (1.25-2 inches) posterior to (below) the sulcus (groove) between the biceps and triceps muscles of the patient's left arm. New Nexplanon  removed from packaging, Device confirmed in needle, then inserted full length of needle and withdrawn per handbook instructions. Nexplanon  was able to palpated in the patient's left arm; patient palpated the insert herself.  There was minimal blood loss. Patient insertion site covered with guaze and a pressure bandage to reduce any bruising. The patient tolerated the procedure well and was given post procedure instructions.    Buel Avers, MD PGY2 07/21/2024

## 2024-07-21 NOTE — Progress Notes (Signed)
 Post Partum Day 2 Subjective: No complaints, up ad lib, voiding, and tolerating PO.  Patient denies any headaches, visual symptoms, RUQ/epigastric pain or other concerning symptoms.  Baby is stable at bedside, breastfeeding.   Objective: Blood pressure 133/85, pulse 70, temperature 98 F (36.7 C), temperature source Oral, resp. rate 16, height 5' 3 (1.6 m), weight 82.6 kg, last menstrual period 10/31/2023, SpO2 99%, unknown if currently breastfeeding. Patient Vitals for the past 24 hrs:  BP Temp Temp src Pulse Resp SpO2  07/21/24 0742 133/85 98 F (36.7 C) Oral 70 16 99 %  07/21/24 0546 126/73 97.9 F (36.6 C) Oral 67 17 97 %  07/21/24 0118 -- 98.1 F (36.7 C) Oral -- -- --  07/21/24 0003 135/73 (!) 97.4 F (36.3 C) Oral 60 16 98 %  07/20/24 1919 (!) 147/85 97.8 F (36.6 C) Oral 64 17 98 %  07/20/24 1814 -- -- -- -- 18 --  07/20/24 1715 -- -- -- -- 18 --  07/20/24 1608 137/84 98.1 F (36.7 C) Oral 70 20 99 %  07/20/24 1439 -- -- -- -- 20 --  07/20/24 1342 -- -- -- -- 20 --  07/20/24 1226 130/73 98.1 F (36.7 C) Oral 76 20 99 %  07/20/24 1045 -- -- -- -- 17 --  07/20/24 0942 -- -- -- -- 16 --    Physical Exam:  General: alert, cooperative, and no distress Lochia: appropriate Uterine Fundus: firm Incision:  DVT Evaluation: No evidence of DVT seen on physical exam.  Recent Labs    07/19/24 1646 07/20/24 0429  HGB 10.9* 9.7*  HCT 32.8* 28.7*    Assessment/Plan: S/P magnesium  sulfate, continue  Nifedipine  XR 30 mg which was increased to bid today, Lasix  and K. Continue to monitor BP closely. Patient desires inpatient Nexplanon  prior to discharge, this will be done Continue routine care.   LOS: 3 days   Gloris Hugger, MD 07/21/2024, 9:20 AM

## 2024-07-21 NOTE — Clinical Social Work Maternal (Signed)
 CLINICAL SOCIAL WORK MATERNAL/CHILD NOTE  Patient Details  Name: Pamela [name] MRN: [id number] Date of Birth: 1996/08/24  Date:  07/21/2024  Clinical Social Worker Initiating Note:  Sharyne Roulette, LCSWA Date/Time: Initiated:  07/21/24/1107     Child's Name:  Gustave Novak   Biological Parents:  Mother, Father (FOB: Devonda Novak, DOB: 04/08/1987)   Need for Interpreter:  None   Reason for Referral:  Current Substance Use/Substance Use During Pregnancy     Address:  [address] [place] [address]    Phone number:  [phone] (home)     Additional phone number:   Household Members/Support Persons (HM/SP):       HM/SP Name Relationship DOB or Age  HM/SP -1        HM/SP -2        HM/SP -3        HM/SP -4        HM/SP -5        HM/SP -6        HM/SP -7        HM/SP -8          Natural Supports (not living in the home):  Immediate Family, Extended Family   Professional Supports: None   Employment: Full-time   Type of Work: Public librarian   Education:  Research scientist (physical sciences)   Homebound arranged:    Surveyor, quantity Resources:  [employer]   Other Resources:  [employer], Sales executive     Cultural/Religious Considerations Which May Impact Care:    Strengths:  Ability to meet basic needs  , Home prepared for child  , Pediatrician chosen   Psychotropic Medications:         Pediatrician:    [facility]  Pediatrician List:   [facility] [place]    [place] Other ([facility])  [facility]      Pediatrician Fax Number:    Risk Factors/Current Problems:  Substance Use     Cognitive State:  Able to Concentrate  , Alert  , Goal Oriented     Mood/Affect:  Calm  , Comfortable  , Relaxed  , Euthymic     CSW Assessment: CSW was consulted due to [facility] use during pregnancy. CSW met with MOB at bedside to complete assessment. When CSW entered room, MOB was observed laying in hospital bed.  FOB was present holding infant on couch nearby. CSW introduced self and requested to speak with MOB alone. MOB provided verbal consent to continue with FOB present. CSW explained reason for consult. MOB presented as calm, was agreeable to consult and remained engaged throughout encounter.   MOB confirmed demographic information on file. CSW inquired about MOB's mental health history. MOB denied a history of mental health diagnoses/symptoms. CSW inquired about supports. MOB identified FOB, her family and FOB's family as her main supports. CSW assessed for safety. MOB denied current SI/HI.  CSW provided education regarding the baby blues period vs. perinatal mood disorders, discussed treatment and gave resources for mental health follow up if concerns arise.  CSW recommends self-evaluation during the postpartum time period using the New Mom Checklist from Postpartum Progress and encouraged MOB to contact a medical professional if symptoms are noted at any time.    MOB reports she has all needed items for infant, including a car seat and bassinet. MOB denied transportation barriers.  CSW informed MOB about hospital drug screen policy due to  MOB's +UDS 05/2024. CSW explained that infant's UDS resulted positive for THC and infant's CDS would continue to be monitored. CSW explained that CPS report would be made due to infant's positive UDS. MOB expressed understanding. CSW inquired about substance use during pregnancy. MOB reports she smoked marijuana due to feeling nauseas during pregnancy. MOB reports she last smoked marijuana 1 week ago. MOB denied using other illicit substances during pregnancy.  CSW provided review of Sudden Infant Death Syndrome (SIDS) precautions.    CSW placed call to [facility] Department of Social Services CPS line and made CPS report to CPS social worker Royce Gaskins due to infant's +UDS for [facility]. Per social worker Gaskins, the report was not accepted.  CSW  identifies no further need for intervention and no barriers to discharge at this time.   CSW Plan/Description:  No Further Intervention Required/No Barriers to Discharge, Perinatal Mood and Anxiety Disorder (PMADs) Education, Child Protective Service Report  , Hospital Drug Screen Policy Information, Sudden Infant Death Syndrome (SIDS) Education, CSW Will Continue to Monitor Umbilical Cord Tissue Drug Screen Results and Make Report if Warranted    Kamden Reber K Ianna Salmela, LCSWA 07/21/2024, 11:09 AM

## 2024-07-22 LAB — RH IG WORKUP (INCLUDES ABO/RH)
Fetal Screen: NEGATIVE
Gestational Age(Wks): 39
Unit division: 0

## 2024-07-26 ENCOUNTER — Telehealth: Payer: Self-pay

## 2024-07-26 ENCOUNTER — Telehealth (INDEPENDENT_AMBULATORY_CARE_PROVIDER_SITE_OTHER)

## 2024-07-26 ENCOUNTER — Encounter: Payer: Self-pay | Admitting: Advanced Practice Midwife

## 2024-07-26 VITALS — BP 165/113

## 2024-07-26 DIAGNOSIS — Z0130 Encounter for examination of blood pressure without abnormal findings: Secondary | ICD-10-CM

## 2024-07-26 DIAGNOSIS — O165 Unspecified maternal hypertension, complicating the puerperium: Secondary | ICD-10-CM

## 2024-07-26 NOTE — Telephone Encounter (Signed)
 Carmickle called to give you her blood pressure reading.

## 2024-07-26 NOTE — Telephone Encounter (Signed)
 Patient called with blood pressure reading of 154/105. Had MyChart visit earlier today but had not taken her blood pressure medication. Blood pressure reading two hours after blood pressure medication was taken. Discussed meds and BP with Nidia Daring FNP. Advised to increase nifedipine  to 60mg  two times a day and to come back on Monday for a Blood pressure check and to bring her home cuff for comparison. PreE signs and symptoms reviewed and patient aware of when to head back to [facility].

## 2024-07-26 NOTE — Progress Notes (Signed)
   NURSE VISIT- BLOOD PRESSURE CHECK  I connected with Con Fret on 07/26/2024 by MyChart video and verified that I am speaking with the correct person using two identifiers.   I discussed the limitations of evaluation and management by telemedicine. The patient expressed understanding and agreed to proceed.  Nurse is at the office, and patient is at home.   SUBJECTIVE:  Pamela [name] is a 28 y.o. G54P1001 female here for BP check. She is postpartum, delivery date 07/19/24  Patient states she has not taken her blood pressure yet this morning. Blood pressure were 166/115 and 165/113  HYPERTENSION ROS:  postpartum:  Severe headaches that don't go away with tylenol /other medicines: No  Visual changes (seeing spots/double/blurred vision) No  Severe pain under right breast breast or in center of upper chest No  Severe nausea/vomiting No  Taking medicines as instructed yes    OBJECTIVE:  BP (!) 165/113   LMP 10/31/2023   Appearance alert, well appearing, and in no distress.  ASSESSMENT: Postpartum  blood pressure check  PLAN: Discussed with Nidia Daring FNP   Recommendations: Take Bp meds now, in two hours call or message with blood pressure   Follow-up: In two hours.   Franco Duley E Kalani Baray  07/26/2024 9:54 AM

## 2024-07-29 ENCOUNTER — Ambulatory Visit: Admitting: *Deleted

## 2024-07-29 VITALS — BP 120/78 | HR 101

## 2024-07-29 DIAGNOSIS — Z0130 Encounter for examination of blood pressure without abnormal findings: Secondary | ICD-10-CM

## 2024-07-29 DIAGNOSIS — Z8759 Personal history of other complications of pregnancy, childbirth and the puerperium: Secondary | ICD-10-CM

## 2024-07-29 MED ORDER — FLUCONAZOLE 150 MG PO TABS: ORAL_TABLET | ORAL | 0 refills | Status: AC

## 2024-07-29 NOTE — Progress Notes (Signed)
   NURSE VISIT- BLOOD PRESSURE CHECK  SUBJECTIVE:  Agustina Witzke is a 28 y.o. G49P1001 female here for BP check. She is postpartum, delivery date 8/29 . Also requesting Diflucan  for yeast infection-started antibiotics for a tooth ache.  HYPERTENSION ROS:  Postpartum:  Severe headaches that don't go away with tylenol /other medicines: No  Visual changes (seeing spots/double/blurred vision) No  Severe pain under right breast breast or in center of upper chest No  Severe nausea/vomiting No  Taking medicines as instructed yes    OBJECTIVE:  BP 120/78 (BP Location: Right Arm, Patient Position: Sitting, Cuff Size: Normal)   Pulse (!) 101   LMP 10/31/2023   Breastfeeding No   Appearance alert, well appearing, and in no distress.  ASSESSMENT: Postpartum  blood pressure check  PLAN: Discussed with Luke Fetters, CNM, [facility]   Recommendations: decrease to 30 mg BID two days prior to [facility] visit   Follow-up: as scheduled   Rutherford Rover  07/29/2024 3:15 PM

## 2024-07-30 ENCOUNTER — Telehealth: Payer: Self-pay

## 2024-07-30 ENCOUNTER — Encounter: Payer: Self-pay | Admitting: Obstetrics & Gynecology

## 2024-07-30 ENCOUNTER — Other Ambulatory Visit: Payer: Self-pay | Admitting: Obstetrics & Gynecology

## 2024-07-30 ENCOUNTER — Telehealth: Payer: Self-pay | Admitting: Family Medicine

## 2024-07-30 NOTE — Telephone Encounter (Signed)
 Feliz Fret patient's mother calling for update, I informed her that her message from when she called earlier was directed to the OBGYN office and there has not been any updates since then.

## 2024-07-30 NOTE — Telephone Encounter (Signed)
 Redirecting to the correct office.

## 2024-07-30 NOTE — Telephone Encounter (Signed)
Pt needs a note to go back to work. 

## 2024-07-30 NOTE — Telephone Encounter (Signed)
 Copied from CRM [id number]. Topic: General - Other >> Jul 30, 2024  4:05 PM Turkey B wrote:  Reason for CRM: Patsient mother called in states needs a corrected Dr's note for work for patient from 07/18/24-07/21/24 from when patient was in the hospital  >> Jul 30, 2024  4:12 PM Turkey B wrote: DISREGARD THIS MESSAGE

## 2024-07-30 NOTE — Telephone Encounter (Signed)
 Not the correct office as patient hasn't been seen here since 2020.  I think you were aiming for her Ob/GYN.  I've cc'd this to her.

## 2024-07-31 ENCOUNTER — Encounter: Payer: Self-pay | Admitting: *Deleted

## 2024-08-13 ENCOUNTER — Encounter: Payer: Self-pay | Admitting: Women's Health

## 2024-08-28 ENCOUNTER — Other Ambulatory Visit

## 2024-08-28 ENCOUNTER — Encounter: Payer: Self-pay | Admitting: Women's Health

## 2024-08-28 ENCOUNTER — Ambulatory Visit: Admitting: Women's Health

## 2024-08-28 DIAGNOSIS — Z8632 Personal history of gestational diabetes: Secondary | ICD-10-CM | POA: Diagnosis not present

## 2024-08-28 DIAGNOSIS — F53 Postpartum depression: Secondary | ICD-10-CM

## 2024-08-28 DIAGNOSIS — Z975 Presence of (intrauterine) contraceptive device: Secondary | ICD-10-CM | POA: Diagnosis not present

## 2024-08-28 DIAGNOSIS — Z131 Encounter for screening for diabetes mellitus: Secondary | ICD-10-CM

## 2024-08-28 DIAGNOSIS — Z8759 Personal history of other complications of pregnancy, childbirth and the puerperium: Secondary | ICD-10-CM

## 2024-08-28 MED ORDER — SERTRALINE HCL 25 MG PO TABS: 25.0000 mg | ORAL_TABLET | Freq: Every day | ORAL | 6 refills | Status: AC

## 2024-08-28 NOTE — Progress Notes (Signed)
 POSTPARTUM VISIT Patient name: Pamela [name] MRN [id number]  Date of birth: 01-Nov-1996 Chief Complaint:   Postpartum Care (Vaginal delivery,stitches)  History of Present Illness:   Pamela [name] is a 28 y.o. G44P1001 Caucasian female being seen today for a postpartum visit. She is 5 weeks postpartum following a spontaneous vaginal delivery at 39.1 gestational weeks. IOL: yes, for diabetes mellitus A1DM and gestational hypertension, developed severe pre-e . Anesthesia: epidural.  Laceration: 1st degree.  Complications: none. Inpatient contraception: yes Nexplanon .   Pregnancy complicated by A1DM, GHTN. Tobacco use: no. Substance use disorder: no. Last pap smear: 01/10/24 and results were NILM w/ HRHPV negative. Next pap smear due: 2028 Patient's last menstrual period was 10/31/2023.  Postpartum course has been complicated by PPHTN, d/c'd on lasix /K= x 5d and nifedipine  30mg  BID>increased to 60mg  BID, was instructed to decrease to 30mg  BID 2d prior to this visit. Has completely stopped nifedipine , home bp's have been normal. Bleeding flow about like a period. Bowel function is normal. Bladder function is normal. Urinary incontinence? no, fecal incontinence? no Patient is not sexually active. Last sexual activity: prior to birth of baby. Desired contraception: Nexplanon  placed at hospital  . Patient does want a pregnancy in the future.  Desired family size is 2 children.   Upstream - 08/28/24 0935       Pregnancy Intention Screening   Does the patient want to become pregnant in the next year? No    Does the patient's partner want to become pregnant in the next year? No    Would the patient like to discuss contraceptive options today? No      Contraception Wrap Up   Current Method Hormonal Implant    End Method Hormonal Implant    Contraception Counseling Provided No         The pregnancy intention screening data noted above was reviewed. Potential methods of contraception were discussed.  The patient elected to proceed with Hormonal Implant.  Edinburgh Postpartum Depression Screening: positive, does have h/o depression- no meds. Interested in meds/therapy. Denies SI/HI/II. Sleeping well. Decreased appetite. Doesn't find joy in things as much.   Edinburgh Postnatal Depression Scale - 08/28/24 0938       Edinburgh Postnatal Depression Scale:  In the Past 7 Days   I have been able to laugh and see the funny side of things. 0    I have looked forward with enjoyment to things. 0    I have blamed myself unnecessarily when things went wrong. 3    I have been anxious or worried for no good reason. 2    I have felt scared or panicky for no good reason. 2    Things have been getting on top of me. 2    I have been so unhappy that I have had difficulty sleeping. 1    I have felt sad or miserable. 2    I have been so unhappy that I have been crying. 2    The thought of harming myself has occurred to me. 0    Edinburgh Postnatal Depression Scale Total 14             01/10/2024   10:27 AM 07/02/2020    2:29 PM 07/24/2019    3:11 PM 06/28/2019    3:11 PM  GAD 7 : Generalized Anxiety Score  Nervous, Anxious, on Edge 0 1 2 2   Control/stop worrying 0 1 0 1  Worry too much - different things 0 1 0 2  Trouble relaxing 0 1 0 3  Restless 0 0 0 0  Easily annoyed or irritable 0 1 0 2  Afraid - awful might happen 0 0 0 1  Total GAD 7 Score 0 5 2 11   Anxiety Difficulty   Not difficult at all      Baby's course has been uncomplicated. Baby is feeding by bottle. Infant has a pediatrician/family doctor? Yes.  Childcare strategy if returning to work/school: did not discuss.  Pt has material needs met for her and baby: Yes.   Review of Systems:   Pertinent items are noted in HPI Denies Abnormal vaginal discharge w/ itching/odor/irritation, headaches, visual changes, shortness of breath, chest pain, abdominal pain, severe nausea/vomiting, or problems with urination or bowel movements. Pertinent  History Reviewed:  Reviewed past medical,surgical, obstetrical and family history.  Reviewed problem list, medications and allergies. OB History  Gravida Para Term Preterm AB Living  1 1 1  0 0 1  SAB IAB Ectopic Multiple Live Births  0 0 0 0 1    # Outcome Date GA Lbr Len/2nd Weight Sex Type Anes PTL Lv  1 Term 07/19/24 [redacted]w[redacted]d 17:07 / 01:04 7 lb 1.9 oz (3.23 kg) F Vag-Spont EPI  LIV     Birth Comments: WNL   Physical Assessment:   Vitals:   08/28/24 0931  BP: 124/83  Pulse: 67  Weight: 155 lb (70.3 kg)  Height: 5' 2 (1.575 m)  Body mass index is 28.35 kg/m.       Physical Examination:   General appearance: alert, well appearing, and in no distress  Mental status: alert, oriented to person, place, and time  Skin: warm & dry   Cardiovascular: normal heart rate noted   Respiratory: normal respiratory effort, no distress   Breasts: deferred, no complaints   Abdomen: soft, non-tender   Pelvic: lac healing well. Thin prep pap obtained: No  Rectal: not examined  Extremities: Edema: none   Chaperone: Peggy Dones       No results found for this or any previous visit (from the past 24 hours).  Assessment & Plan:  1) Postpartum exam 2) 5 wks s/p spontaneous vaginal delivery after IOL for pre-e/A1DM 3) bottle feeding 4) Depression screening 5) Contraception s/p Nexplanon  insertion at [facility] 6) A1DM during pregnancy> does labs at [facility], order placed today, pt to go sometime this week to do 7) Resolved PPHTN> ok to stay off meds 8) PPD> rx zoloft 25mg , understands it takes a few weeks to notice improvement, [facility] referral, f/u 4wks   Essential components of care per ACOG recommendations:  1.  Mood and well being:  If positive depression screen, discussed and plan developed.  If using tobacco we discussed reduction/cessation and risk of relapse If current substance abuse, we discussed and referral to local resources was offered.   2. Infant care and feeding:  If breastfeeding,  discussed returning to work, pumping, breastfeeding-associated pain, guidance regarding return to fertility while lactating if not using another method. If needed, patient was provided with a letter to be allowed to pump q 2-3hrs to support lactation in a private location with access to a refrigerator to store breastmilk.   Recommended that all caregivers be immunized for flu, pertussis and other preventable communicable diseases If pt does not have material needs met for her/baby, referred to local resources for help obtaining these.  3. Sexuality, contraception and birth spacing Provided guidance regarding sexuality, management of dyspareunia, and resumption of intercourse Discussed avoiding interpregnancy interval <  and recommended birth spacing of 18 months  4. Sleep and fatigue Discussed coping options for fatigue and sleep disruption Encouraged family/partner/community support of 4 hrs of uninterrupted sleep to help with mood and fatigue  5. Physical recovery  If pt had a C/S, assessed incisional pain and providing guidance on normal vs prolonged recovery If pt had a laceration, perineal healing and pain reviewed.  If urinary or fecal incontinence, discussed management and referred to PT or uro/gyn if indicated  Patient is safe to resume physical activity. Discussed attainment of healthy weight.  6.  Chronic disease management Discussed pregnancy complications if any, and their implications for future childbearing and long-term maternal health. Review recommendations for prevention of recurrent pregnancy complications, such as 17 hydroxyprogesterone caproate to reduce risk for recurrent PTB not applicable, or aspirin  to reduce risk of preeclampsia yes. Pt had GDM: yes. If yes, 2hr GTT scheduled: yes. Reviewed medications and non-pregnant dosing including consideration of whether pt is breastfeeding using a reliable resource such as LactMed: not applicable Referred for f/u w/ PCP or  subspecialist providers as indicated: not applicable (PCP [facility])  7. Health maintenance Mammogram at 27yo or earlier if indicated Pap smears as indicated  Meds:  Meds ordered this encounter  Medications   sertraline (ZOLOFT) 25 MG tablet    Sig: Take 1 tablet (25 mg total) by mouth daily.    Dispense:  30 tablet    Refill:  6    Follow-up: Return in about 4 weeks (around 09/25/2024) for GYN f/u, CNM, in person.   Orders Placed This Encounter  Procedures   Glucose Tolerance, 2 Hours w/1 Hour   Amb ref to [facility]    Suzen JONELLE Fetters CNM, [facility] 08/28/2024 10:12 AM

## 2024-09-05 NOTE — BH Specialist Note (Unsigned)
 Integrated Behavioral Health via Telemedicine Visit  09/10/2024 Pamela [name] [id number]  Number of Integrated Behavioral Health Clinician visits: 1- Initial Visit  Session Start time: 1020   Session End time: 1109  Total time in minutes: 49   Referring Provider: Suzen Fetters, CNM Patient/Family location: Home [facility] Provider location: [facility]  All persons participating in visit: Patient Pamela [name] and [facility] Nethan Caudillo   Types of Service: Individual psychotherapy and Video visit  I connected with Con Fret and/or Con Cleaves n/a via  Telephone or Video Enabled Telemedicine Application  (Video is Caregility application) and verified that I am speaking with the correct person using two identifiers. Discussed confidentiality: Yes   I discussed the limitations of telemedicine and the availability of in person appointments.  Discussed there is a possibility of technology failure and discussed alternative modes of communication if that failure occurs.  I discussed that engaging in this telemedicine visit, they consent to the provision of behavioral healthcare and the services will be billed under their insurance.  Patient and/or legal guardian expressed understanding and consented to Telemedicine visit: Yes   Presenting Concerns: Patient and/or family reports the following symptoms/concerns: Increased symptoms of anxiety and depression, while adjusting to new motherhood and processing birth experience; pt is coping by prioritizing sleep, getting out of the house and taking Zoloft; open to implementing additional self-coping strategy today.  Duration of problem: Postpartum; Severity of problem: mild  Patient and/or Family's Strengths/Protective Factors: Social connections, Concrete supports in place (healthy food, safe environments, etc.), Sense of purpose, and Physical Health (exercise, healthy diet, medication  compliance, etc.)  Goals Addressed: Patient will:  Reduce symptoms of: anxiety and depression   Increase knowledge and/or ability of: healthy habits and self-management skills   Demonstrate ability to: Increase healthy adjustment to current life circumstances, Increase adequate support systems for patient/family, and Increase motivation to adhere to plan of care  Progress towards Goals: Ongoing  Interventions: Interventions utilized:  Mindfulness or Management consultant, Psychoeducation and/or Health Education, and Link to [employer] Standardized Assessments completed: GAD-7 and PHQ 9  Patient and/or Family Response: Patient agrees with treatment plan.  Clinical Assessment/Diagnosis  Adjustment disorder with mixed anxiety and depressed mood   Patient may benefit from psychoeducation and brief therapeutic interventions regarding coping with symptoms of depression and anxiety .  Plan: Follow up with behavioral health clinician on : Two weeks; Call Jannetta Massey at [phone], as needed. Behavioral recommendations:  -Continue taking Zoloft as prescribed -Continue prioritizing healthy self-care (regular meals, adequate rest; allowing practical help from supportive friends and family)  -Consider new mom support group as needed at either [email or web address] or [email or web address]  -CALM relaxation breathing exercise twice daily (morning; at bedtime with sleep sounds); as needed throughout the day.  Referral(s): Integrated Art gallery manager (In Clinic) and [employer]:  new mom support  I discussed the assessment and treatment plan with the patient and/or parent/guardian. They were provided an opportunity to ask questions and all were answered. They agreed with the plan and demonstrated an understanding of the instructions.   They were advised to call back or seek an in-person evaluation if the symptoms worsen or if the condition fails to improve as  anticipated.  Warren JAYSON Mering, LCSW     09/09/2024   10:50 AM 01/10/2024   10:27 AM 07/02/2020    2:27 PM 07/24/2019    3:08 PM 06/28/2019    3:11 PM  Depression screen PHQ 2/9  Decreased Interest 1 0 2 0 2  Down, Depressed, Hopeless 1 0 1 0 2  PHQ - 2 Score 2 0 3 0 4  Altered sleeping 0 0 3 1 2   Tired, decreased energy 1 1 2 1 2   Change in appetite 1 0 3 0 0  Feeling bad or failure about yourself  0 0 1 0 1  Trouble concentrating 0 0 2 0 1  Moving slowly or fidgety/restless 0 0 0 0 0  Suicidal thoughts 0 0 0 0 0  PHQ-9 Score 4 1 14 2 10   Difficult doing work/chores    Not difficult at all       09/09/2024   10:54 AM 01/10/2024   10:27 AM 07/02/2020    2:29 PM 07/24/2019    3:11 PM  GAD 7 : Generalized Anxiety Score  Nervous, Anxious, on Edge 1 0 1 2  Control/stop worrying 1 0 1 0  Worry too much - different things 1 0 1 0  Trouble relaxing 1 0 1 0  Restless 1 0 0 0  Easily annoyed or irritable 1 0 1 0  Afraid - awful might happen 1 0 0 0  Total GAD 7 Score 7 0 5 2  Anxiety Difficulty    Not difficult at all

## 2024-09-09 ENCOUNTER — Ambulatory Visit: Payer: Self-pay | Admitting: Clinical

## 2024-09-09 DIAGNOSIS — F4323 Adjustment disorder with mixed anxiety and depressed mood: Secondary | ICD-10-CM

## 2024-09-09 NOTE — Patient Instructions (Signed)
 [facility] for [facility] at [facility] [address] [place], [place] [address] [phone] (main office) [phone] [facility] office)  New Parent Support Groups [email or web address]

## 2024-09-25 ENCOUNTER — Encounter: Payer: Self-pay | Admitting: Women's Health

## 2024-09-25 ENCOUNTER — Other Ambulatory Visit (HOSPITAL_COMMUNITY)
Admission: RE | Admit: 2024-09-25 | Discharge: 2024-09-25 | Disposition: A | Discharge status: Home / Self Care | Source: Ambulatory Visit | Attending: Women's Health | Admitting: Women's Health

## 2024-09-25 ENCOUNTER — Ambulatory Visit (INDEPENDENT_AMBULATORY_CARE_PROVIDER_SITE_OTHER): Admitting: Women's Health

## 2024-09-25 VITALS — BP 115/76 | HR 76 | Ht 63.0 in | Wt 158.0 lb

## 2024-09-25 DIAGNOSIS — Z113 Encounter for screening for infections with a predominantly sexual mode of transmission: Secondary | ICD-10-CM | POA: Diagnosis not present

## 2024-09-25 DIAGNOSIS — N921 Excessive and frequent menstruation with irregular cycle: Secondary | ICD-10-CM | POA: Diagnosis not present

## 2024-09-25 DIAGNOSIS — F53 Postpartum depression: Secondary | ICD-10-CM

## 2024-09-25 DIAGNOSIS — O99345 Other mental disorders complicating the puerperium: Secondary | ICD-10-CM

## 2024-09-25 DIAGNOSIS — Z975 Presence of (intrauterine) contraceptive device: Secondary | ICD-10-CM

## 2024-09-25 NOTE — Progress Notes (Signed)
 GYN VISIT Patient name: Pamela [name] MRN [id number]  Date of birth: 27-Dec-1995 Chief Complaint:   Follow-up (Zoloft)  History of Present Illness:   Pamela [name] is a 28 y.o. 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 Took for 2wks then stopped b/c made her have bad dreams and felt more insecure/anxious on it. Starting to feel better now and doesn't want to try any other medicines.  Bleeding intermittently w/ Nexplanon , has had for about 2 months. Not heavy, just irritating.  Wants STD screen, had trichomonas in Jan w/ neg POC, just wants retested since started having sex again after baby. Denies abnormal discharge, itching/odor/irritation.   No LMP recorded. The current method of family planning is Nexplanon .  Last pap 01/10/24. Results were: NILM w/ HRHPV negative     09/25/2024    9:18 AM 09/09/2024   10:50 AM 01/10/2024   10:27 AM 07/02/2020    2:27 PM 07/24/2019    3:08 PM  Depression screen PHQ 2/9  Decreased Interest 1 1 0 2 0  Down, Depressed, Hopeless 1 1 0 1 0  PHQ - 2 Score 2 2 0 3 0  Altered sleeping 0 0 0 3 1  Tired, decreased energy  1 1 2 1   Change in appetite 1 1 0 3 0  Feeling bad or failure about yourself  0 0 0 1 0  Trouble concentrating 0 0 0 2 0  Moving slowly or fidgety/restless 0 0 0 0 0  Suicidal thoughts 0 0 0 0 0  PHQ-9 Score 3 4 1 14 2   Difficult doing work/chores     Not difficult at all        09/25/2024    9:20 AM 09/09/2024   10:54 AM 01/10/2024   10:27 AM 07/02/2020    2:29 PM  GAD 7 : Generalized Anxiety Score  Nervous, Anxious, on Edge 1 1 0 1  Control/stop worrying 1 1 0 1  Worry too much - different things 1 1 0 1  Trouble relaxing 1 1 0 1  Restless 0 1 0 0  Easily annoyed or irritable 1 1 0 1  Afraid - awful might happen 1 1 0 0  Total GAD 7 Score 6 7 0 5  Anxiety Difficulty Not difficult at all        Review of Systems:   Pertinent items are noted in HPI Denies fever/chills,  dizziness, headaches, visual disturbances, fatigue, shortness of breath, chest pain, abdominal pain, vomiting, abnormal vaginal discharge/itching/odor/irritation, problems with periods, bowel movements, urination, or intercourse unless otherwise stated above.  Pertinent History Reviewed:  Reviewed past medical,surgical, social, obstetrical and family history.  Reviewed problem list, medications and allergies. Physical Assessment:   Vitals:   09/25/24 0911  BP: 115/76  Pulse: 76  Weight: 158 lb (71.7 kg)  Height: 5' 3 (1.6 m)  Body mass index is 27.99 kg/m.       Physical Examination:   General appearance: alert, well appearing, and in no distress  Mental status: alert, oriented to person, place, and time  Skin: warm & dry   Cardiovascular: normal heart rate noted  Respiratory: normal respiratory effort, no distress  Abdomen: soft, non-tender   Pelvic: examination not indicated, self-collected CV swab  Extremities: no edema   Chaperone: N/A  No results found for this or any previous visit (from the past 24 hours).  Assessment & Plan:  1) PPD/anxiety> stopped zoloft b/c made feel  worse, doing well off meds  2) Irregular bleeding w/ Nexplanon > just placed ago, discussed normal bleeding patterns w/ Nexplanon , if still like this in 1 mth let us  know and will try megace   3) STD screen> per request, self collected CV swab  Meds: No orders of the defined types were placed in this encounter.   No orders of the defined types were placed in this encounter.   Return in about 1 year (around 09/25/2025) for Physical.  Suzen JONELLE Fetters CNM, WHNP-BC 09/25/2024 9:50 AM

## 2024-09-26 ENCOUNTER — Ambulatory Visit: Payer: Self-pay | Admitting: Women's Health

## 2024-09-26 LAB — CERVICOVAGINAL ANCILLARY ONLY
Bacterial Vaginitis (gardnerella): POSITIVE — AB
Candida Glabrata: NEGATIVE
Candida Vaginitis: NEGATIVE
Chlamydia: NEGATIVE
Comment: NEGATIVE
Comment: NEGATIVE
Comment: NEGATIVE
Comment: NEGATIVE
Comment: NEGATIVE
Comment: NORMAL
Neisseria Gonorrhea: NEGATIVE
Trichomonas: NEGATIVE

## 2024-09-26 MED ORDER — METRONIDAZOLE 500 MG PO TABS: 500.0000 mg | ORAL_TABLET | Freq: Two times a day (BID) | ORAL | 0 refills | Status: AC

## 2024-11-15 ENCOUNTER — Ambulatory Visit: Admitting: Family Medicine

## 2024-11-15 ENCOUNTER — Ambulatory Visit: Payer: Self-pay

## 2024-11-15 NOTE — Telephone Encounter (Signed)
 Reached out to patient, patient stated that she was calling to schedule an appointment for her self and her daughter. Appointments scheduled accordingly.

## 2024-11-15 NOTE — Progress Notes (Deleted)
 "  Subjective: CC:*** PCP: Jolinda Norene HERO, DO YEP:Pamela [name] is a 28 y.o. female presenting to clinic today for:  ***   ROS: Per HPI  Allergies[1] Past Medical History:  Diagnosis Date   BV (bacterial vaginosis) 01/28/2015   Dysmenorrhea 10/14/2014   History of chlamydia 09/22/2015   History of UTI 09/22/2015   Ovarian cyst 08/03/2017   UTI (lower urinary tract infection) 08/21/2015   Current Medications[2] Social History   Socioeconomic History   Marital status: Single    Spouse name: Not on file   Number of children: Not on file   Years of education: Not on file   Highest education level: Not on file  Occupational History   Not on file  Tobacco Use   Smoking status: Former    Current packs/day: 0.50    Average packs/day: 0.5 packs/day for 4.0 years (2.0 ttl pk-yrs)    Types: Cigarettes   Smokeless tobacco: Never   Tobacco comments:    smokes 10 cig daily  Vaping Use   Vaping status: Every Day  Substance and Sexual Activity   Alcohol use: No   Drug use: No   Sexual activity: Yes    Birth control/protection: Implant  Other Topics Concern   Not on file  Social History Narrative   Not on file   Social Drivers of Health   Tobacco Use: Medium Risk (09/25/2024)   Patient History    Smoking Tobacco Use: Former    Smokeless Tobacco Use: Never    Passive Exposure: Not on Actuary Strain: Low Risk (06/13/2024)   Received from [facility]   Overall Financial Resource Strain (CARDIA)    How hard is it for you to pay for the very basics like food, housing, medical care, and heating?: Not hard at all  Food Insecurity: No Food Insecurity (07/18/2024)   Epic    Worried About Running Out of Food in the Last Year: Never true    Ran Out of Food in the Last Year: Never true  Transportation Needs: No Transportation Needs (07/18/2024)   Epic    Lack of Transportation (Medical): No    Lack of Transportation (Non-Medical): No  Physical  Activity: Sufficiently Active (01/10/2024)   Exercise Vital Sign    Days of Exercise per Week: 4 days    Minutes of Exercise per Session: 80 min  Stress: Stress Concern Present (01/10/2024)   [employer] of Occupational Health - Occupational Stress Questionnaire    Feeling of Stress : To some extent  Social Connections: Unknown (01/10/2024)   Social Connection and Isolation Panel    Frequency of Communication with Friends and Family: More than three times a week    Frequency of Social Gatherings with Friends and Family: Three times a week    Attends Religious Services: Never    Active Member of Clubs or Organizations: No    Attends Banker Meetings: Never    Marital Status: Patient declined  Intimate Partner Violence: Not At Risk (07/18/2024)   Epic    Fear of Current or Ex-Partner: No    Emotionally Abused: No    Physically Abused: No    Sexually Abused: No  Depression (PHQ2-9): Low Risk (09/25/2024)   Depression (PHQ2-9)    PHQ-2 Score: 3  Alcohol Screen: Low Risk (01/10/2024)   Alcohol Screen    Last Alcohol Screening Score (AUDIT): 0  Housing: Low Risk (07/20/2024)   Epic    Unable to Pay for  Housing in the Last Year: No    Number of Times Moved in the Last Year: 0    Homeless in the Last Year: No  Utilities: Not At Risk (07/18/2024)   Epic    Threatened with loss of utilities: No  Health Literacy: Adequate Health Literacy (01/10/2024)   B1300 Health Literacy    Frequency of need for help with medical instructions: Never   Family History  Problem Relation Age of Onset   Colitis Mother    Neuropathy Mother    Cancer Maternal Grandmother        rectal   Diabetes Paternal Grandmother    Hypertension Paternal Grandmother    COPD Paternal Grandfather    Hypertension Paternal Grandfather     Objective: Office vital signs reviewed. There were no vitals taken for this visit.  Physical Examination:  General: Awake, alert, *** nourished, No acute  distress HEENT: Normal    Neck: No masses palpated. No lymphadenopathy    Ears: Tympanic membranes intact, normal light reflex, no erythema, no bulging    Eyes: PERRLA, extraocular membranes intact, sclera ***    Nose: nasal turbinates moist, *** nasal discharge    Throat: moist mucus membranes, no erythema, *** tonsillar exudate.  Airway is patent Cardio: regular rate and rhythm, S1S2 heard, no murmurs appreciated Pulm: clear to auscultation bilaterally, no wheezes, rhonchi or rales; normal work of breathing on room air GI: soft, non-tender, non-distended, bowel sounds present x4, no hepatomegaly, no splenomegaly, no masses GU: external vaginal tissue ***, cervix ***, *** punctate lesions on cervix appreciated, *** discharge from cervical os, *** bleeding, *** cervical motion tenderness, *** abdominal/ adnexal masses Extremities: warm, well perfused, No edema, cyanosis or clubbing; +*** pulses bilaterally MSK: *** gait and *** station Skin: dry; intact; no rashes or lesions Neuro: *** Strength and light touch sensation grossly intact, *** DTRs ***/4  Assessment/ Plan: 28 y.o. female   Flu-like symptoms   ***   Pamela Ricciardi M Ladona Rosten, DO [facility] [phone]     [1]  Allergies Allergen Reactions   Penicillins Other (See Comments)    Don't respond to PCN.   [2]  Current Outpatient Medications:    fluconazole  (DIFLUCAN ) 150 MG tablet, Take 1 tablet now, repeat in 3 days if symptoms have not resolved (Patient not taking: Reported on 08/28/2024), Disp: 2 tablet, Rfl: 0   furosemide  (LASIX ) 20 MG tablet, Take 1 tablet (20 mg total) by mouth 2 (two) times daily. (Patient not taking: Reported on 08/28/2024), Disp: 10 tablet, Rfl: 0   ibuprofen  (ADVIL ) 600 MG tablet, Take 1 tablet (600 mg total) by mouth every 6 (six) hours. (Patient not taking: Reported on 09/25/2024), Disp: 30 tablet, Rfl: 0   metroNIDAZOLE  (FLAGYL ) 500 MG tablet, Take 1 tablet (500 mg total)  by mouth 2 (two) times daily., Disp: 14 tablet, Rfl: 0   NIFEdipine  (ADALAT  CC) 30 MG 24 hr tablet, Take 1 tablet (30 mg total) by mouth 2 (two) times daily. (Patient not taking: Reported on 09/25/2024), Disp: 30 tablet, Rfl: 1   potassium chloride  SA (KLOR-CON  M) 20 MEQ tablet, Take 1 tablet (20 mEq total) by mouth 2 (two) times daily. (Patient not taking: Reported on 09/25/2024), Disp: 10 tablet, Rfl: 0   sertraline  (ZOLOFT ) 25 MG tablet, Take 1 tablet (25 mg total) by mouth daily. (Patient not taking: Reported on 09/25/2024), Disp: 30 tablet, Rfl: 6  "

## 2024-11-18 ENCOUNTER — Encounter: Payer: Self-pay | Admitting: Family Medicine
